# Patient Record
Sex: Male | Born: 1970 | Race: White | Hispanic: No | Marital: Married | Smoking: Former smoker
Health system: Southern US, Community
[De-identification: ages and names within clinical notes are randomized; demographics above are authoritative.]

## PROBLEM LIST (undated history)

## (undated) DIAGNOSIS — I10 Essential (primary) hypertension: Secondary | ICD-10-CM

## (undated) DIAGNOSIS — M1990 Unspecified osteoarthritis, unspecified site: Secondary | ICD-10-CM

## (undated) DIAGNOSIS — L409 Psoriasis, unspecified: Secondary | ICD-10-CM

## (undated) HISTORY — DX: Essential (primary) hypertension: I10

## (undated) HISTORY — DX: Psoriasis, unspecified: L40.9

## (undated) HISTORY — DX: Unspecified osteoarthritis, unspecified site: M19.90

---

## 2017-01-21 NOTE — Progress Notes (Signed)
Office Visit Note  Patient: Andrew Chapman             Date of Birth: [birth year]           MRN: [id number]             PCP: Lorelei Pont, DO Referring: Lorelei Pont, DO Visit Date: 01/26/2017 Occupation: Loading unloading at a furniture factory    Subjective:  Joint pain, psoriasis   History of Present Illness: Andrew Chapman is a 46 y.o. male with history of psoriasis and psoriatic arthritis seen in consultation per request of his PCP. According to patient he has had psoriasis for the last 20 years. He has used topical agents off-and-on which helps to some extent. He usually has a lot of pruritus and burning sensation in his skin. He has tried tanning bed without much relief. He recalls for the last few years he's been having increased joint pain for which he takes Aleve on when necessary basis. He experiences increased pain with the weather change. He has had lower back pain off and on. He also gives history of pain and discomfort in his bilateral hands. He has noticed a knot on his right second finger. He complains of pain and discomfort in his bilateral hip joints, bilateral knee joints, bilateral ankles bilateral feet. He also gives history of plantar fasciitis. He has bilateral lower extremity pain. His ankle swells off and on. He has intermittent swelling in his right finger. He has extensive psoriasis which involves of the scalp face trunk and extremities. He was given methotrexate 2 years ago which she took for about one year without much relief. His PCP applied for an [employer] approved Humira but wanted a prescription from rheumatologist. This positive family history of psoriasis in his mother she also suffers him psoriatic arthritis.  Activities of Daily Living:  Patient reports morning stiffness for10 minutes.   Patient Denies nocturnal pain.  Difficulty dressing/grooming: Denies Difficulty climbing stairs: Denies Difficulty getting out of chair: Denies Difficulty using  hands for taps, buttons, cutlery, and/or writing: Denies   Review of Systems  Constitutional: Positive for fatigue. Negative for night sweats and weakness ( ).  HENT: Negative for mouth sores, mouth dryness and nose dryness.   Eyes: Negative for redness and dryness.  Respiratory: Negative for shortness of breath and difficulty breathing.   Cardiovascular: Negative for chest pain, palpitations, hypertension, irregular heartbeat and swelling in legs/feet.  Gastrointestinal: Negative for constipation and diarrhea.  Endocrine: Negative for increased urination.  Musculoskeletal: Positive for arthralgias, joint pain, joint swelling and morning stiffness. Negative for myalgias, muscle weakness, muscle tenderness and myalgias.  Skin: Positive for rash. Negative for color change, hair loss, nodules/bumps, skin tightness, ulcers and sensitivity to sunlight.  Allergic/Immunologic: Negative for susceptible to infections.  Neurological: Negative for dizziness, fainting, memory loss and night sweats.  Hematological: Negative for swollen glands.  Psychiatric/Behavioral: Negative for depressed mood and sleep disturbance. The patient is not nervous/anxious.     PMFS History:  There are no active problems to display for this patient.   No past medical history on file.  No family history on file. No past surgical history on file. Social History   Social History Narrative  . No narrative on file     Objective: Vital Signs: BP 138/76   Pulse 78   Resp 14   Ht 6\' 3"  (1.905 m)   Wt 264 lb (119.7 kg)   BMI 33.00 kg/m    Physical Exam  Constitutional: He is oriented to person, place, and time. He appears well-developed and well-nourished.  HENT:  Head: Normocephalic and atraumatic.  Eyes: Conjunctivae and EOM are normal. Pupils are equal, round, and reactive to light.  Neck: Normal range of motion. Neck supple.  Cardiovascular: Normal rate, regular rhythm and normal heart sounds.     Pulmonary/Chest: Effort normal and breath sounds normal.  Abdominal: Soft. Bowel sounds are normal.  Neurological: He is alert and oriented to person, place, and time.  Skin: Skin is warm and dry. Capillary refill takes less than 2 seconds. Rash noted.  Extensive plaque psoriasis on bilateral upper extremities lower extremities trunk ,scalp and face.  Psychiatric: He has a normal mood and affect. His behavior is normal.  Nursing note and vitals reviewed.    Musculoskeletal Exam: C-spine and thoracic lumbar spine good range of motion. He has discomfort range of motion of his lumbar spine. He has tenderness on palpation of bilateral SI joint, more so on the right side. Shoulder joints elbow joints wrist joints are good range of motion. He is tenderness across MCPs PIPs and DIP joints but no active synovitis was noted. He good range of motion of his hip joints with some discomfort. Knee joints ankles MTPs PIPs DIPs are good range of motion. He has some tenderness across MTPs and PIPs of his feet with no active synovitis noted. He had tenderness over bilateral plantar fascia and bilateral lateral epicondyle area.  CDAI Exam: CDAI Homunculus Exam:   Tenderness:  Right hand: 2nd MCP, 3rd MCP and 2nd DIP Left hand: 2nd MCP RLE: tibiofemoral and tibiotalar LLE: tibiofemoral and tibiotalar Right foot: 2nd MTP Left foot: 2nd MTP  Joint Counts:  CDAI Tender Joint count: 5 CDAI Swollen Joint count: 0  Global Assessments:  Patient Global Assessment: 6 Provider Global Assessment: 4  CDAI Calculated Score: 15    Investigation: Findings:  Negative PPD 10/22/16     Imaging: Xr Foot 2 Views Left  Result Date: 01/26/2017 No MTP PIP/DIP narrowing was noted. No erosive changes were noted. No intertarsal joint space changes were noted. Normal x-ray of the foot  Xr Foot 2 Views Right  Result Date: 01/26/2017 No MTP PIP/DIP narrowing was noted. No erosive changes were noted. No intertarsal joint  space changes were noted. Normal x-ray of the foot  Xr Hand 2 View Left  Result Date: 01/26/2017 Minimal PIP/DIP and CMC changes were noted. No MCP joint narrowing or intercarpal joint narrowing was noted. No erosive changes were noted. 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 No MCP joint narrowing or intercarpal joint narrowing was noted. No erosive changes were noted. 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 No chondrocalcinosis. Impression mild osteoarthritis and mild chondromalacia patella  Xr Knee 3 View Right  Result Date: 01/26/2017 Moderate medial compartment narrowing. Mild patellofemoral narrowing. Impression moderate osteoarthritis and mild chondromalacia patella.  Xr Pelvis 1-2 Views  Result Date: 01/26/2017 Bilateral SI joint sclerosis was noted more so on the right side. These findings are consistent with inflammatory arthritis.   Speciality Comments: No specialty comments available.    Procedures:  No procedures performed Allergies: Patient has no known allergies.   Assessment / Plan:     Visit Diagnoses: Psoriatic arthropathy (HCC) patient has history of multiple arthralgias and intermittent  swelling. He has tenderness over multiple joints today. He tenderness over bilateral lateral epicondyle area plantar fascia and SI joint.  Pain hands: We'll obtain x-ray of bilateral hands today.  Pain knee joints: We'll obtain x-ray of bilateral knee joints  Pain feet. Obtain x-ray of bilateral feet  SI joint discomfort  Positive family history of psoriasis and psoriatic arthritis in his mother  Psoriasis: He has extensive psoriasis involving his scalp face, trunk and extremities.  High risk medication use - Treated with  methotrexate-inadequate response. Patient states he was approved for Humira. He has never tried Biologics. We had detailed discussion regarding different treatment options and side effects. We will apply for Humira. Was approved and the labs are available and can start him on the injections.  History of gastroesophageal reflux (GERD)    Orders: Orders Placed This Encounter  Procedures  . XR Hand 2 View Left  . XR Hand 2 View Right  . XR Foot 2 Views Left  . XR Foot 2 Views Right  . XR KNEE 3 VIEW LEFT  . XR KNEE 3 VIEW RIGHT  . XR Pelvis 1-2 Views  . DG Chest 2 View  . CBC with Differential/Platelet  . Urinalysis, Routine w reflex microscopic  . COMPLETE METABOLIC PANEL WITH GFR  . Hepatitis panel, acute  . Serum protein electrophoresis with reflex  . IgG, IgA, IgM  . Quantiferon tb gold assay (blood)  . HIV antibody   No orders of the defined types were placed in this encounter.   Face-to-face time spent with patient was 50 minutes. 50% of time was spent in counseling and coordination of care.  Follow-Up Instructions: Return for Psoriatic arthritis, psoriasis.   Pollyann Savoy, MD  Note - This record has been created using Animal nutritionist.  Chart creation errors have been sought, but may not always  have been located. Such creation errors do not reflect on  the standard of medical care.

## 2017-01-26 ENCOUNTER — Ambulatory Visit (INDEPENDENT_AMBULATORY_CARE_PROVIDER_SITE_OTHER): Payer: BLUE CROSS/BLUE SHIELD | Admitting: Rheumatology

## 2017-01-26 ENCOUNTER — Telehealth: Payer: Self-pay

## 2017-01-26 ENCOUNTER — Encounter (INDEPENDENT_AMBULATORY_CARE_PROVIDER_SITE_OTHER): Payer: Self-pay

## 2017-01-26 ENCOUNTER — Ambulatory Visit (INDEPENDENT_AMBULATORY_CARE_PROVIDER_SITE_OTHER): Payer: Self-pay

## 2017-01-26 ENCOUNTER — Ambulatory Visit (HOSPITAL_COMMUNITY)
Admission: RE | Admit: 2017-01-26 | Discharge: 2017-01-26 | Disposition: A | Discharge status: Home / Self Care | Payer: BLUE CROSS/BLUE SHIELD | Source: Ambulatory Visit | Attending: Rheumatology | Admitting: Rheumatology

## 2017-01-26 ENCOUNTER — Encounter: Payer: Self-pay | Admitting: Rheumatology

## 2017-01-26 VITALS — BP 138/76 | HR 78 | Resp 14 | Ht 75.0 in | Wt 264.0 lb

## 2017-01-26 DIAGNOSIS — M25561 Pain in right knee: Secondary | ICD-10-CM

## 2017-01-26 DIAGNOSIS — L4050 Arthropathic psoriasis, unspecified: Secondary | ICD-10-CM | POA: Diagnosis not present

## 2017-01-26 DIAGNOSIS — G8929 Other chronic pain: Secondary | ICD-10-CM

## 2017-01-26 DIAGNOSIS — L409 Psoriasis, unspecified: Secondary | ICD-10-CM

## 2017-01-26 DIAGNOSIS — Z8719 Personal history of other diseases of the digestive system: Secondary | ICD-10-CM

## 2017-01-26 DIAGNOSIS — Z114 Encounter for screening for human immunodeficiency virus [HIV]: Secondary | ICD-10-CM

## 2017-01-26 DIAGNOSIS — M25562 Pain in left knee: Secondary | ICD-10-CM | POA: Diagnosis not present

## 2017-01-26 DIAGNOSIS — Z111 Encounter for screening for respiratory tuberculosis: Secondary | ICD-10-CM

## 2017-01-26 DIAGNOSIS — M79671 Pain in right foot: Secondary | ICD-10-CM

## 2017-01-26 DIAGNOSIS — M79641 Pain in right hand: Secondary | ICD-10-CM | POA: Diagnosis not present

## 2017-01-26 DIAGNOSIS — M79642 Pain in left hand: Secondary | ICD-10-CM | POA: Diagnosis not present

## 2017-01-26 DIAGNOSIS — M79672 Pain in left foot: Secondary | ICD-10-CM

## 2017-01-26 DIAGNOSIS — M533 Sacrococcygeal disorders, not elsewhere classified: Secondary | ICD-10-CM | POA: Diagnosis not present

## 2017-01-26 DIAGNOSIS — Z79899 Other long term (current) drug therapy: Secondary | ICD-10-CM | POA: Diagnosis not present

## 2017-01-26 DIAGNOSIS — Z1159 Encounter for screening for other viral diseases: Secondary | ICD-10-CM | POA: Diagnosis not present

## 2017-01-26 LAB — CBC WITH DIFFERENTIAL/PLATELET
BASOS ABS: 88 {cells}/uL (ref 0–200)
Basophils Relative: 1 %
Eosinophils Absolute: 264 cells/uL (ref 15–500)
Eosinophils Relative: 3 %
HEMATOCRIT: 47 % (ref 38.5–50.0)
HEMOGLOBIN: 15.8 g/dL (ref 13.2–17.1)
LYMPHS ABS: 2112 {cells}/uL (ref 850–3900)
Lymphocytes Relative: 24 %
MCH: 30.4 pg (ref 27.0–33.0)
MCHC: 33.6 g/dL (ref 32.0–36.0)
MCV: 90.4 fL (ref 80.0–100.0)
MONO ABS: 704 {cells}/uL (ref 200–950)
MPV: 9.1 fL (ref 7.5–12.5)
Monocytes Relative: 8 %
NEUTROS ABS: 5632 {cells}/uL (ref 1500–7800)
NEUTROS PCT: 64 %
Platelets: 343 10*3/uL (ref 140–400)
RBC: 5.2 MIL/uL (ref 4.20–5.80)
RDW: 14 % (ref 11.0–15.0)
WBC: 8.8 10*3/uL (ref 3.8–10.8)

## 2017-01-26 LAB — COMPLETE METABOLIC PANEL WITH GFR
ALBUMIN: 4.6 g/dL (ref 3.6–5.1)
ALK PHOS: 81 U/L (ref 40–115)
ALT: 25 U/L (ref 9–46)
AST: 18 U/L (ref 10–40)
BILIRUBIN TOTAL: 0.7 mg/dL (ref 0.2–1.2)
BUN: 22 mg/dL (ref 7–25)
CALCIUM: 9.8 mg/dL (ref 8.6–10.3)
CO2: 24 mmol/L (ref 20–31)
CREATININE: 0.92 mg/dL (ref 0.60–1.35)
Chloride: 104 mmol/L (ref 98–110)
GFR, Est African American: >89 mL/min (ref >=60)
GFR, Est Non African American: >89 mL/min (ref >=60)
GLUCOSE: 83 mg/dL (ref 65–99)
POTASSIUM: 4.9 mmol/L (ref 3.5–5.3)
SODIUM: 141 mmol/L (ref 135–146)
TOTAL PROTEIN: 7.3 g/dL (ref 6.1–8.1)

## 2017-01-26 NOTE — Telephone Encounter (Signed)
A prior authorization was submitted via Cover My Meds for Humira. The authorization was approved from 12/27/2016 through 04/26/2017. Per Juanetta GoslingLaney, a representative from [employer], we will receive a letter via fax and one will be mailed to patient. His medication will require Rx to be filled at a specialty pharmacy. [facility] [phone]  Will send document to scan center.   Case ID: [id number]  Abran DukeHopkins, Bently Morath, CPhT 11:49 AM

## 2017-01-26 NOTE — Progress Notes (Signed)
Pharmacy Note Subjective: Patient presents today to the [facility] to see Dr. Corliss Skainseveshwar.   Patient seen by the pharmacist for counseling on Humira.    Objective: TB Test: ordered today Hepatitis panel: ordered today HIV: ordered today CBC, CMP: ordered today  Assessment/Plan:  Counseled patient that Humira is a TNF blocking agent.  Reviewed Humira dose of 40 mg every other week.  Counseled patient on purpose, proper use, and adverse effects of Humira.  Reviewed the most common adverse effects including infections, headache, and injection site reactions. Discussed that there is the possibility of an increased risk of malignancy but it is not well understood if this increased risk is due to the medication or the disease state.  Advised patient to get yearly dermatology exams due to risk of skin cancer.  Reviewed the importance of regular labs while on Humira therapy.  Counseled patient that Humira should be held prior to scheduled surgery.  Counseled patient to avoid live vaccines while on Humira.  Advised patient to get annual influenza vaccine and the pneumococcal vaccine as needed.  Provided patient with medication education material and answered all questions.  Patient voiced understanding.  Patient consented to Humira.  Will upload consent into the media tab.  Reviewed storage instructions of Humira.  Humira was approved by patient's insurance whlie he was in the office.  Prescription will have to go [facility].  Will plan to send in Humira prescription once labs return.  Patient is aware that initial fill must be delivered to clinic and patient will need nurse visit for initial injection.    Lilla Shookachel Henderson, Pharm.D., BCPS Clinical Pharmacist Pager: [phone] Phone: [phone] 01/26/2017 11:03 AM

## 2017-01-26 NOTE — Patient Instructions (Addendum)
Go to [facility] for your Chest Xray, [address] [employer]   We recommend getting a pneumonia vaccine.  Please discuss this with your primary care provider.    Adalimumab Injection What is this medicine? ADALIMUMAB (a dal AYE mu mab) is used to treat rheumatoid and psoriatic arthritis. It is also used to treat ankylosing spondylitis, Crohn's disease, ulcerative colitis, plaque psoriasis, hidradenitis suppurativa, and uveitis. This medicine may be used for other purposes; ask your health care provider or pharmacist if you have questions. COMMON BRAND NAME(S): CYLTEZO, Humira What should I tell my health care provider before I take this medicine? They need to know if you have any of these conditions: -diabetes -heart disease -hepatitis B or history of hepatitis B infection -immune system problems -infection or history of infections -multiple sclerosis -recently received or scheduled to receive a vaccine -scheduled to have surgery -tuberculosis, a positive skin test for tuberculosis or have recently been in close contact with someone who has tuberculosis -an unusual reaction to adalimumab, other medicines, mannitol, latex, rubber, foods, dyes, or preservatives -pregnant or trying to get pregnant -breast-feeding How should I use this medicine? This medicine is for injection under the skin. You will be taught how to prepare and give this medicine. Use exactly as directed. Take your medicine at regular intervals. Do not take your medicine more often than directed. A special MedGuide will be given to you by the pharmacist with each prescription and refill. Be sure to read this information carefully each time. It is important that you put your used needles and syringes in a special sharps container. Do not put them in a trash can. If you do not have a sharps container, call your pharmacist or healthcare provider to get one. Talk to your pediatrician regarding the use of this  medicine in children. While this drug may be prescribed for children as young as 2 years for selected conditions, precautions do apply. The manufacturer of the medicine offers free information to patients and their health care partners. Call 4788380125 for more information. Overdosage: If you think you have taken too much of this medicine contact a poison control center or emergency room at once. NOTE: This medicine is only for you. Do not share this medicine with others. What if I miss a dose? If you miss a dose, take it as soon as you can. If it is almost time for your next dose, take only that dose. Do not take double or extra doses. Give the next dose when your next scheduled dose is due. Call your doctor or health care professional if you are not sure how to handle a missed dose. What may interact with this medicine? Do not take this medicine with any of the following medications: -abatacept -anakinra -etanercept -infliximab -live virus vaccines -rilonacept This medicine may also interact with the following medications: -vaccines This list may not describe all possible interactions. Give your health care provider a list of all the medicines, herbs, non-prescription drugs, or dietary supplements you use. Also tell them if you smoke, drink alcohol, or use illegal drugs. Some items may interact with your medicine. What should I watch for while using this medicine? Visit your doctor or health care professional for regular checks on your progress. Tell your doctor or healthcare professional if your symptoms do not start to get better or if they get worse. You will be tested for tuberculosis (TB) before you start this medicine. If your doctor prescribes any medicine for  TB, you should start taking the TB medicine before starting this medicine. Make sure to finish the full course of TB medicine. Call your doctor or health care professional if you get a cold or other infection while receiving  this medicine. Do not treat yourself. This medicine may decrease your body's ability to fight infection. Talk to your doctor about your risk of cancer. You may be more at risk for certain types of cancers if you take this medicine. What side effects may I notice from receiving this medicine? Side effects that you should report to your doctor or health care professional as soon as possible: -allergic reactions like skin rash, itching or hives, swelling of the face, lips, or tongue -breathing problems -changes in vision -chest pain -fever, chills, or any other sign of infection -numbness or tingling -red, scaly patches or raised bumps on the skin -swelling of the ankles -swollen lymph nodes in the neck, underarm, or groin areas -unexplained weight loss -unusual bleeding or bruising -unusually weak or tired Side effects that usually do not require medical attention (report to your doctor or health care professional if they continue or are bothersome): -headache -nausea -redness, itching, swelling, or bruising at site where injected This list may not describe all possible side effects. Call your doctor for medical advice about side effects. You may report side effects to FDA at 1-800-FDA-1088. Where should I keep my medicine? Keep out of the reach of children. Store in the original container and in the refrigerator between 2 and 8 degrees C (36 and 46 degrees F). Do not freeze. The product may be stored in a cool carrier with an ice pack, if needed. Protect from light. Throw away any unused medicine after the expiration date. NOTE: This sheet is a summary. It may not cover all possible information. If you have questions about this medicine, talk to your doctor, pharmacist, or health care provider.  2018 Elsevier/Gold Standard (2015-02-26 11:11:43)

## 2017-01-26 NOTE — Progress Notes (Signed)
WNL

## 2017-01-27 LAB — URINALYSIS, ROUTINE W REFLEX MICROSCOPIC
Bilirubin Urine: NEGATIVE
Glucose, UA: NEGATIVE
Hgb urine dipstick: NEGATIVE
KETONES UR: NEGATIVE
Leukocytes, UA: NEGATIVE
NITRITE: NEGATIVE
PH: 6 (ref 5.0–8.0)
Protein, ur: NEGATIVE
SPECIFIC GRAVITY, URINE: 1.025 (ref 1.001–1.035)

## 2017-01-27 LAB — HEPATITIS PANEL, ACUTE
HCV Ab: NEGATIVE
HEP B S AG: NEGATIVE
Hep A IgM: NONREACTIVE
Hep B C IgM: NONREACTIVE

## 2017-01-27 LAB — HIV ANTIBODY (ROUTINE TESTING W REFLEX): HIV 1&2 Ab, 4th Generation: NONREACTIVE

## 2017-01-28 LAB — QUANTIFERON TB GOLD ASSAY (BLOOD)
INTERFERON GAMMA RELEASE ASSAY: NEGATIVE
Mitogen-Nil: 7.09 IU/mL
QUANTIFERON NIL VALUE: 0.04 [IU]/mL
Quantiferon Tb Ag Minus Nil Value: 0 IU/mL

## 2017-01-28 LAB — IGG, IGA, IGM
IGA: 204 mg/dL (ref 81–463)
IGG (IMMUNOGLOBIN G), SERUM: 1104 mg/dL (ref 694–1618)
IgM, Serum: 59 mg/dL (ref 48–271)

## 2017-01-28 LAB — PROTEIN ELECTROPHORESIS, SERUM, WITH REFLEX
ALBUMIN ELP: 4.4 g/dL (ref 3.8–4.8)
Alpha-1-Globulin: 0.3 g/dL (ref 0.2–0.3)
Alpha-2-Globulin: 0.7 g/dL (ref 0.5–0.9)
BETA 2: 0.4 g/dL (ref 0.2–0.5)
Beta Globulin: 0.4 g/dL (ref 0.4–0.6)
Gamma Globulin: 1.1 g/dL (ref 0.8–1.7)
TOTAL PROTEIN, SERUM ELECTROPHOR: 7.3 g/dL (ref 6.1–8.1)

## 2017-02-01 NOTE — Progress Notes (Signed)
WNL

## 2017-02-03 ENCOUNTER — Telehealth: Payer: Self-pay | Admitting: Pharmacist

## 2017-02-03 MED ORDER — ADALIMUMAB 40 MG/0.8ML ~~LOC~~ AJKT: 40.0000 mg | AUTO-INJECTOR | SUBCUTANEOUS | 2 refills | Status: DC

## 2017-02-03 NOTE — Telephone Encounter (Signed)
Plan per 01/26/17 office visit was to start patient on Humira once labs and chest x-ray were back.  Labs were all normal and chest x-ray showed no active cardiopulmonary disease.    Called patient to discuss Humira.  I spoke to his wife who confirms patient wants to wait and start initial Humira injection at his follow up visit on 03/03/17 as he does not want to take off work.  I will send Humira prescription to the specialty pharmacy ([facility]) and have them deliver initial prescription to clinic for clinic administration.    Lilla Shookachel Fitzhugh Vizcarrondo, Pharm.D., BCPS, CPP Clinical Pharmacist Pager: [phone] Phone: [phone] 02/03/2017 3:38 PM

## 2017-02-28 ENCOUNTER — Telehealth: Payer: Self-pay | Admitting: Pharmacist

## 2017-02-28 NOTE — Telephone Encounter (Signed)
Patient has new patient follow up on 03/03/17 and wanted to start Humira at new patient visit.  I called [facility] to check on status of patient's prescription as medication has not been delivered to clinic yet.  I spoke to BrittonDenise who reports the medication was delivered 02/14/17 to patient's home.  I called patient and spoke to his wife who confirmed that patient received prescription.  It is being stored in the refrigerator.  I advised that we cannot let them bring in medication for administration in clinic therefore I will set aside a sample for patient to use for his initial injection on Thursday.  She voiced understanding.  Lilla Shookachel Henderson, Pharm.D., BCPS, CPP Clinical Pharmacist Pager: [phone] Phone: [phone] 02/28/2017 12:44 PM

## 2017-03-01 DIAGNOSIS — G8929 Other chronic pain: Secondary | ICD-10-CM | POA: Insufficient documentation

## 2017-03-01 DIAGNOSIS — L4050 Arthropathic psoriasis, unspecified: Secondary | ICD-10-CM | POA: Insufficient documentation

## 2017-03-01 DIAGNOSIS — Z8719 Personal history of other diseases of the digestive system: Secondary | ICD-10-CM | POA: Insufficient documentation

## 2017-03-01 DIAGNOSIS — M533 Sacrococcygeal disorders, not elsewhere classified: Secondary | ICD-10-CM

## 2017-03-01 DIAGNOSIS — Z79899 Other long term (current) drug therapy: Secondary | ICD-10-CM | POA: Insufficient documentation

## 2017-03-01 DIAGNOSIS — L409 Psoriasis, unspecified: Secondary | ICD-10-CM | POA: Insufficient documentation

## 2017-03-01 NOTE — Progress Notes (Signed)
Office Visit Note  Patient: Andrew Chapman             Date of Birth: 10/31/1970           MRN: [id number]             PCP: Emelda Fear, DO Referring: Emelda Fear, DO Visit Date: 03/03/2017 Occupation: @GUAROCC @    Subjective:  Pain in joints and psoriasis.   History of Present Illness: Roddy Bellamy is a 46 y.o. male with history of psoriatic arthritis and psoriasis. He states he continues to have pain and discomfort in his joints specialty's ankle joint and SI joints. His psoriasis is still quite active.  Activities of Daily Living:  Patient reports morning stiffness for 1 hour.   Patient Denies nocturnal pain.  Difficulty dressing/grooming: Denies Difficulty climbing stairs: Denies Difficulty getting out of chair: Denies Difficulty using hands for taps, buttons, cutlery, and/or writing: Denies   Review of Systems  Constitutional: Positive for fatigue. Negative for night sweats and weakness ( ).  HENT: Negative for mouth sores, mouth dryness and nose dryness.   Eyes: Negative for redness and dryness.  Respiratory: Negative for shortness of breath and difficulty breathing.   Cardiovascular: Negative for chest pain, palpitations, hypertension, irregular heartbeat and swelling in legs/feet.  Gastrointestinal: Negative for constipation and diarrhea.  Endocrine: Negative for increased urination.  Musculoskeletal: Positive for arthralgias, joint pain, joint swelling and morning stiffness. Negative for myalgias, muscle weakness, muscle tenderness and myalgias.  Skin: Positive for rash. Negative for color change, hair loss, nodules/bumps, skin tightness, ulcers and sensitivity to sunlight.  Allergic/Immunologic: Negative for susceptible to infections.  Neurological: Negative for dizziness, fainting, memory loss and night sweats.  Hematological: Negative for swollen glands.  Psychiatric/Behavioral: Negative for depressed mood and sleep disturbance. The patient is not  nervous/anxious.     PMFS History:  Patient Active Problem List   Diagnosis Date Noted  . Family history of psoriatic arthritis 03/02/2017  . Psoriasis 03/01/2017  . Psoriatic arthropathy ([place]) 03/01/2017  . High risk medication use 03/01/2017  . History of gastroesophageal reflux (GERD) 03/01/2017  . 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 No pertinent past medical history.  History reviewed. No pertinent family history. History reviewed. No pertinent surgical history. Social History   Social History Narrative  . No narrative on file     Objective: Vital Signs: BP 139/86 (BP Location: Left Arm, Patient Position: Sitting, Cuff Size: Normal)   Pulse 77   Resp 16   Ht 6' 3"  (1.905 m)   Wt 271 lb (122.9 kg)   BMI 33.87 kg/m    Physical Exam  Constitutional: He is oriented to person, place, and time. He appears well-developed and well-nourished.  HENT:  Head: Normocephalic and atraumatic.  Eyes: Pupils are equal, round, and reactive to light. Conjunctivae and EOM are normal.  Neck: Normal range of motion. Neck supple.  Cardiovascular: Normal rate, regular rhythm and normal heart sounds.   Pulmonary/Chest: Effort normal and breath sounds normal.  Abdominal: Soft. Bowel sounds are normal.  Neurological: He is alert and oriented to person, place, and time.  Skin: Skin is warm and dry. Capillary refill takes less than 2 seconds. Rash noted.  Extensive psoriasis over trunk and extremeties  Psychiatric: He has a normal mood and affect. His behavior is normal.  Nursing note and vitals reviewed.    Musculoskeletal Exam: C-spine and thoracic lumbar spine good range of motion. He has tenderness over bilateral SI  joint area. Shoulder joints are good range of motion. He is tenderness over bilateral lateral epicondyle area. He is some tenderness across his PIPs and DIP joints but no synovitis was noted. Hip joints and knee joints are good range of motion. Is tenderness  over bilateral knee joints and ankle joints.  CDAI Exam: CDAI Homunculus Exam:   Tenderness:  RLE: tibiofemoral and tibiotalar LLE: tibiofemoral and tibiotalar  Joint Counts:  CDAI Tender Joint count: 2 CDAI Swollen Joint count: 0  Global Assessments:  Patient Global Assessment: 7 Provider Global Assessment: 7  CDAI Calculated Score: 16    Investigation: Findings:  01/26/2017 Hepatitis panel, acute ;HIV antibody ; IgG, IgA, IgM; Quantiferon tb gold assay ; Serum protein electrophoresis with reflex; and Urinalysis, Routine w reflex microscopic all normal /negative     CBC Latest Ref Rng & Units 01/26/2017  WBC 3.8 - 10.8 K/uL 8.8  Hemoglobin 13.2 - 17.1 g/dL 15.8  Hematocrit 38.5 - 50.0 % 47.0  Platelets 140 - 400 K/uL 343   CMP Latest Ref Rng & Units 01/26/2017  Glucose 65 - 99 mg/dL 83  BUN 7 - 25 mg/dL 22  Creatinine 0.60 - 1.35 mg/dL 0.92  Sodium 135 - 146 mmol/L 141  Potassium 3.5 - 5.3 mmol/L 4.9  Chloride 98 - 110 mmol/L 104  CO2 20 - 31 mmol/L 24  Calcium 8.6 - 10.3 mg/dL 9.8  Total Protein 6.1 - 8.1 g/dL 7.3  Total Bilirubin 0.2 - 1.2 mg/dL 0.7  Alkaline Phos 40 - 115 U/L 81  AST 10 - 40 U/L 18  ALT 9 - 46 U/L 25    Imaging: No results found.  Speciality Comments: No specialty comments available.    Procedures:  No procedures performed Allergies: Patient has no known allergies.   Assessment / Plan:     Visit Diagnoses: Psoriatic arthropathy ([place]) - History of arthritis, epicondylitis, plantar fasciitis, sacroiliitis and psoriasis.We had consented him on Humira last visit. Side effects and contraindications were discussed at length. He was given his first Humira injection in the office today and was observed in the office for 30 minutes for any side effects. He tolerated Humira well.  Psoriasis: He has extensive psoriasis involving his extremities and trunk. We will see response to Humira. If he has inadequate response we may add methotrexate at next  visit.  High risk medication use -  (Methotrexate -inadequate response), Humira 40 mg subcutaneous first injection given today. We will check labs in a month and then every 3 months to monitor for drug toxicity. 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  History of gastroesophageal reflux (GERD)  Family history of psoriatic arthritis -  in his mother    Orders: Orders Placed This Encounter  Procedures  . CBC with Differential/Platelet  . CMP14+EGFR   No orders of the defined types were placed in this encounter.   Face-to-face time spent with patient was 30 minutes. 50% of time was spent in counseling and coordination of care.  Follow-Up Instructions: Return in about 3 months (around 06/03/2017) for Psoriatic arthritis Psoriasis.   [name], MD  Note - This record has been created using Editor, commissioning.  Chart creation errors have been sought, but may not always  have been located. Such creation errors do not reflect on  the standard of medical care.

## 2017-03-02 DIAGNOSIS — Z840 Family history of diseases of the skin and subcutaneous tissue: Secondary | ICD-10-CM | POA: Insufficient documentation

## 2017-03-02 DIAGNOSIS — Z8261 Family history of arthritis: Secondary | ICD-10-CM | POA: Insufficient documentation

## 2017-03-03 ENCOUNTER — Ambulatory Visit: Payer: Self-pay | Admitting: Rheumatology

## 2017-03-03 ENCOUNTER — Encounter: Payer: Self-pay | Admitting: Rheumatology

## 2017-03-03 ENCOUNTER — Ambulatory Visit (INDEPENDENT_AMBULATORY_CARE_PROVIDER_SITE_OTHER): Payer: BLUE CROSS/BLUE SHIELD | Admitting: Rheumatology

## 2017-03-03 VITALS — BP 139/86 | HR 77 | Resp 16 | Ht 75.0 in | Wt 271.0 lb

## 2017-03-03 DIAGNOSIS — Z8261 Family history of arthritis: Secondary | ICD-10-CM | POA: Diagnosis not present

## 2017-03-03 DIAGNOSIS — M533 Sacrococcygeal disorders, not elsewhere classified: Secondary | ICD-10-CM | POA: Diagnosis not present

## 2017-03-03 DIAGNOSIS — Z8719 Personal history of other diseases of the digestive system: Secondary | ICD-10-CM | POA: Diagnosis not present

## 2017-03-03 DIAGNOSIS — Z79899 Other long term (current) drug therapy: Secondary | ICD-10-CM

## 2017-03-03 DIAGNOSIS — G8929 Other chronic pain: Secondary | ICD-10-CM | POA: Diagnosis not present

## 2017-03-03 DIAGNOSIS — Z840 Family history of diseases of the skin and subcutaneous tissue: Secondary | ICD-10-CM

## 2017-03-03 DIAGNOSIS — L409 Psoriasis, unspecified: Secondary | ICD-10-CM | POA: Diagnosis not present

## 2017-03-03 DIAGNOSIS — L4050 Arthropathic psoriasis, unspecified: Secondary | ICD-10-CM

## 2017-03-03 NOTE — Patient Instructions (Addendum)
Standing Labs We placed an order today for your standing lab work.    Please come back and get your standing labs in 1 month then every 3 months.  We have open lab Monday through Friday from 8:30-11:30 AM and 1:30-4 PM at the office of Dr. Pollyann Savoy.   The office is located at [address], [place], [place] [address] No appointment is necessary.   Labs are drawn by [facility].  You may receive a bill from [facility] for your lab work. If you have any questions regarding directions or hours of operation,  please call [phone].    Adalimumab Injection What is this medicine? ADALIMUMAB (a dal AYE mu mab) is used to treat rheumatoid and psoriatic arthritis. It is also used to treat ankylosing spondylitis, Crohn's disease, ulcerative colitis, plaque psoriasis, hidradenitis suppurativa, and uveitis. This medicine may be used for other purposes; ask your health care provider or pharmacist if you have questions. COMMON BRAND NAME(S): CYLTEZO, Humira What should I tell my health care provider before I take this medicine? They need to know if you have any of these conditions: -diabetes -heart disease -hepatitis B or history of hepatitis B infection -immune system problems -infection or history of infections -multiple sclerosis -recently received or scheduled to receive a vaccine -scheduled to have surgery -tuberculosis, a positive skin test for tuberculosis or have recently been in close contact with someone who has tuberculosis -an unusual reaction to adalimumab, other medicines, mannitol, latex, rubber, foods, dyes, or preservatives -pregnant or trying to get pregnant -breast-feeding How should I use this medicine? This medicine is for injection under the skin. You will be taught how to prepare and give this medicine. Use exactly as directed. Take your medicine at regular intervals. Do not take your medicine more often than directed. A special MedGuide will be given to you  by the pharmacist with each prescription and refill. Be sure to read this information carefully each time. It is important that you put your used needles and syringes in a special sharps container. Do not put them in a trash can. If you do not have a sharps container, call your pharmacist or healthcare provider to get one. Talk to your pediatrician regarding the use of this medicine in children. While this drug may be prescribed for children as young as 2 years for selected conditions, precautions do apply. The manufacturer of the medicine offers free information to patients and their health care partners. Call [phone] for more information. Overdosage: If you think you have taken too much of this medicine contact a poison control center or emergency room at once. NOTE: This medicine is only for you. Do not share this medicine with others. What if I miss a dose? If you miss a dose, take it as soon as you can. If it is almost time for your next dose, take only that dose. Do not take double or extra doses. Give the next dose when your next scheduled dose is due. Call your doctor or health care professional if you are not sure how to handle a missed dose. What may interact with this medicine? Do not take this medicine with any of the following medications: -abatacept -anakinra -etanercept -infliximab -live virus vaccines -rilonacept This medicine may also interact with the following medications: -vaccines This list may not describe all possible interactions. Give your health care provider a list of all the medicines, herbs, non-prescription drugs, or dietary supplements you use. Also tell them if you smoke, drink  alcohol, or use illegal drugs. Some items may interact with your medicine. What should I watch for while using this medicine? Visit your doctor or health care professional for regular checks on your progress. Tell your doctor or healthcare professional if your symptoms do not start to  get better or if they get worse. You will be tested for tuberculosis (TB) before you start this medicine. If your doctor prescribes any medicine for TB, you should start taking the TB medicine before starting this medicine. Make sure to finish the full course of TB medicine. Call your doctor or health care professional if you get a cold or other infection while receiving this medicine. Do not treat yourself. This medicine may decrease your body's ability to fight infection. Talk to your doctor about your risk of cancer. You may be more at risk for certain types of cancers if you take this medicine. What side effects may I notice from receiving this medicine? Side effects that you should report to your doctor or health care professional as soon as possible: -allergic reactions like skin rash, itching or hives, swelling of the face, lips, or tongue -breathing problems -changes in vision -chest pain -fever, chills, or any other sign of infection -numbness or tingling -red, scaly patches or raised bumps on the skin -swelling of the ankles -swollen lymph nodes in the neck, underarm, or groin areas -unexplained weight loss -unusual bleeding or bruising -unusually weak or tired Side effects that usually do not require medical attention (report to your doctor or health care professional if they continue or are bothersome): -headache -nausea -redness, itching, swelling, or bruising at site where injected This list may not describe all possible side effects. Call your doctor for medical advice about side effects. You may report side effects to FDA at 1-800-FDA-1088. Where should I keep my medicine? Keep out of the reach of children. Store in the original container and in the refrigerator between 2 and 8 degrees C (36 and 46 degrees F). Do not freeze. The product may be stored in a cool carrier with an ice pack, if needed. Protect from light. Throw away any unused medicine after the expiration  date. NOTE: This sheet is a summary. It may not cover all possible information. If you have questions about this medicine, talk to your doctor, pharmacist, or health care provider.  2018 Elsevier/Gold Standard (2015-02-26 11:11:43)

## 2017-03-03 NOTE — Progress Notes (Signed)
Pharmacy Note  Subjective:   Patient is being initiated on Humira.  Patient was previously counseled extensively on Humira on 01/26/17 and consented to initiation of Humira at that time.  Patient presents to clinic today to receive the first dose of Humira.     Objective: CMP     Component Value Date/Time   NA 141 01/26/2017 1120   K 4.9 01/26/2017 1120   CL 104 01/26/2017 1120   CO2 24 01/26/2017 1120   GLUCOSE 83 01/26/2017 1120   BUN 22 01/26/2017 1120   CREATININE 0.92 01/26/2017 1120   CALCIUM 9.8 01/26/2017 1120   PROT 7.3 01/26/2017 1120   ALBUMIN 4.6 01/26/2017 1120   AST 18 01/26/2017 1120   ALT 25 01/26/2017 1120   ALKPHOS 81 01/26/2017 1120   BILITOT 0.7 01/26/2017 1120   GFRNONAA >89 01/26/2017 1120   GFRAA >89 01/26/2017 1120    CBC    Component Value Date/Time   WBC 8.8 01/26/2017 1120   RBC 5.20 01/26/2017 1120   HGB 15.8 01/26/2017 1120   HCT 47.0 01/26/2017 1120   PLT 343 01/26/2017 1120   MCV 90.4 01/26/2017 1120   MCH 30.4 01/26/2017 1120   MCHC 33.6 01/26/2017 1120   RDW 14.0 01/26/2017 1120   LYMPHSABS 2,112 01/26/2017 1120   MONOABS 704 01/26/2017 1120   EOSABS 264 01/26/2017 1120   BASOSABS 88 01/26/2017 1120   TB Gold: negative (01/26/17)  Assessment/Plan:  Reviewed the purpose, proper use, and adverse effects of Humira.  Patient was counseled on how to administer subcutaneous Humira injection using a demonstration pen.  Patient administered the Humira injection using a sample pen.  The medication was administered in the right thigh.  Lot: [id number], Exp. 06/2018.  Patient was monitored for 30 minutes post injection.  No injection site reaction noted.  Patient will need standing lab orders in one month.  Provided patient with standing lab instructions and placed standing lab order.    Lilla Shookachel Shaleta Ruacho, Pharm.D., BCPS Clinical Pharmacist Pager: [phone] Phone: [phone] 03/03/2017 2:42 PM

## 2017-04-06 ENCOUNTER — Telehealth: Payer: Self-pay | Admitting: Radiology

## 2017-04-06 NOTE — Telephone Encounter (Signed)
Probably related to Humira use. He'll continue to monitor labs.

## 2017-04-06 NOTE — Telephone Encounter (Signed)
Received labs for patient via fax today his ALT is 47 otherwise CMP is normal, his CBC is normal   He is on Humira only from you.   Sent for scan

## 2017-04-07 NOTE — Telephone Encounter (Addendum)
I called pt to advise left message that I have sent him a my chart message.

## 2017-04-28 ENCOUNTER — Encounter: Payer: Self-pay | Admitting: Rheumatology

## 2017-04-28 ENCOUNTER — Telehealth: Payer: Self-pay

## 2017-04-28 NOTE — Telephone Encounter (Signed)
Patients PA for Humira expired on 04/26/2017. An authorization was submitted via cover my meds. Received an initial denial due to a previous denial from March 2018 with a previous provider. Called [facility] and spoke with [place]. She was able to process the authorization over the phone.   Humira has been approved from 04/28/2017 through 04/28/2018.  Case ID: [id number]  Abran DukeHopkins, Fran [place]onger, CPhT 2:20 PM

## 2017-04-29 NOTE — Telephone Encounter (Signed)
Called insurance to verify that the patient can get a 90 day supply of the Humira Syringes at a time. Spoke with [place] who states that the pt is able to get a 90 day supply through mail order pharmacy.   Called patient to inform him. Left message on patients voicemail.  Quadry Kampa, [place], CPhT 9:07 AM

## 2017-05-07 ENCOUNTER — Other Ambulatory Visit: Payer: Self-pay | Admitting: Rheumatology

## 2017-05-09 NOTE — Telephone Encounter (Signed)
Last Visit: 03/03/17 Next Visit: 06/07/17 Labs:04/05/17 ALT is 47 otherwise CMP is normal, his CBC is normal  TB Gold: 01/26/17 Neg  Okay to refill per Dr. Corliss Skains

## 2017-05-10 ENCOUNTER — Telehealth: Payer: Self-pay

## 2017-05-10 NOTE — Telephone Encounter (Signed)
Received a faxed request from [facility] for Humira Pens prior authorization. Pt has authorization on file for Humira Syringes. Viewed pt profile and found that patient received an Rx for Pens and used a "pen" for his initial dose given in the clinic on 03/03/2017.   [facility]ed [facility] to get his authorization changed. Spoke with Carlis Stable who was able to update the pt profile and authorize the Humira PENS from 03/29/2017 through 04/28/2018. Patient is able to get a 90 day supply at a time.  Reference number: [id number] Phone number: [phone]  Called patient to update him. Left message on answering machine.   Called [facility] to update. Spoke with Colin Mulders who updated the patients profile with the authorization number. Patient will need this medication this week. She sent a request to the pharmacists to expedite the request to have the medication sent out to the patient. They will reach out to patient today to set up shipment.   Jalia Zuniga, [place], CPhT 11:41 AM

## 2017-05-29 NOTE — Progress Notes (Signed)
Office Visit Note  Patient: Andrew Chapman             Date of Birth: [birth year]           MRN: [id number]             PCP: Emelda Fear, DO Referring: Emelda Fear, DO Visit Date: 06/07/2017 Occupation: _0 @    Subjective:  Muscle pain, left ankle pain.   History of Present Illness: Katlin Ciszewski is a 46 y.o. male with history of psoriatic arthritis and psoriasis. He started Humira in June 2018. He has noticed about 50% improvement in his arthritis symptoms and 90% improvement in his psoriasis. He still continues to have some discomfort in his hands, left ankle joint and SI joints. He also complains of muscle pain which she describes in this bilateral quadriceps area. He has taken methotrexate in the past but discontinued due to inadequate response.  Activities of Daily Living:  Patient reports morning stiffness for 1 hour.   Patient Denies nocturnal pain.  Difficulty dressing/grooming: Denies Difficulty climbing stairs: Denies Difficulty getting out of chair: Denies Difficulty using hands for taps, buttons, cutlery, and/or writing: Denies   Review of Systems  Constitutional: Negative for fatigue, night sweats and weakness.  HENT: Negative for mouth sores, mouth dryness and nose dryness.   Eyes: Negative for redness and dryness.  Respiratory: Negative for shortness of breath and difficulty breathing.   Cardiovascular: Negative for chest pain, palpitations, hypertension, irregular heartbeat and swelling in legs/feet.  Gastrointestinal: Negative for constipation and diarrhea.  Endocrine: Negative for excessive thirst and increased urination.  Genitourinary: Negative for difficulty urinating.  Musculoskeletal: Positive for arthralgias, joint pain, morning stiffness and muscle tenderness. Negative for joint swelling, myalgias, muscle weakness and myalgias.  Skin: Negative for color change, rash, hair loss, nodules/bumps, skin tightness, ulcers and sensitivity to sunlight.    Allergic/Immunologic: Negative for susceptible to infections.  Neurological: Negative for dizziness, fainting, light-headedness, memory loss and night sweats.  Hematological: Negative for bruising/bleeding tendency and swollen glands.  Psychiatric/Behavioral: Negative for depressed mood and sleep disturbance. The patient is not nervous/anxious.     PMFS History:  Patient Active Problem List   Diagnosis Date Noted  . Family history of psoriatic arthritis 03/02/2017  . Psoriasis 03/01/2017  . Psoriatic arthropathy ([place]) 03/01/2017  . High risk medication use 03/01/2017  . History of gastroesophageal reflux (GERD) 03/01/2017  . 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 Arthritis   . Hypertension   . Psoriasis     History reviewed. No pertinent family history. History reviewed. No pertinent surgical history. Social History   Social History Narrative  . No narrative on file     Objective: Vital Signs: BP 128/74 (BP Location: Left Arm, Patient Position: Sitting, Cuff Size: Normal)   Pulse 82   Resp 16   Ht _1  (1.905 m)   Wt 286 lb (129.7 kg)   BMI 35.75 kg/m    Physical Exam  Constitutional: He is oriented to person, place, and time. He appears well-developed and well-nourished.  HENT:  Head: Normocephalic and atraumatic.  Eyes: Pupils are equal, round, and reactive to light. Conjunctivae and EOM are normal.  Neck: Normal range of motion. Neck supple.  Cardiovascular: Normal rate, regular rhythm and normal heart sounds.   Pulmonary/Chest: Effort normal and breath sounds normal.  Abdominal: Soft. Bowel sounds are normal.  Neurological: He is alert and oriented to person, place, and time.  Skin: Skin is warm and dry. Capillary refill takes less than 2 seconds. Rash noted.  Few scales on bilateral elbows were noted.  Psychiatric: He has a normal mood and affect. His behavior is normal.  Nursing note and vitals reviewed.     Musculoskeletal Exam: C-spine and thoracic lumbar spine good range of motion. He has some tenderness over left SI joint. Shoulder joints, elbow joints, wrist joints, MCPs PIPs DIPs with good range of motion. No synovitis was noted. He has some thickening of PIP/DIP joints due to underlying osteoarthritis. Hip joints knee joints are good range of motion. He has some coming off his left ankle joint which she relates to prior injury. There was no evidence of plantar fasciitis or  Achillis tendinitis.  CDAI Exam: CDAI Homunculus Exam:   Tenderness:  LLE: tibiotalar  Joint Counts:  CDAI Tender Joint count: 0 CDAI Swollen Joint count: 0  Global Assessments:  Patient Global Assessment: 4 Provider Global Assessment: 4  CDAI Calculated Score: 8    Investigation: No additional findings.TB Gold: Negative 01/2017 CBC Latest Ref Rng & Units 01/26/2017  WBC 3.8 - 10.8 K/uL 8.8  Hemoglobin 13.2 - 17.1 g/dL 15.8  Hematocrit 38.5 - 50.0 % 47.0  Platelets 140 - 400 K/uL 343   CMP Latest Ref Rng & Units 01/26/2017  Glucose 65 - 99 mg/dL 83  BUN 7 - 25 mg/dL 22  Creatinine 0.60 - 1.35 mg/dL 0.92  Sodium 135 - 146 mmol/L 141  Potassium 3.5 - 5.3 mmol/L 4.9  Chloride 98 - 110 mmol/L 104  CO2 20 - 31 mmol/L 24  Calcium 8.6 - 10.3 mg/dL 9.8  Total Protein 6.1 - 8.1 g/dL 7.3  Total Bilirubin 0.2 - 1.2 mg/dL 0.7  Alkaline Phos 40 - 115 U/L 81  AST 10 - 40 U/L 18  ALT 9 - 46 U/L 25    Imaging: No results found.  Speciality Comments: No specialty comments available.    Procedures:  No procedures performed Allergies: Patient has no known allergies.   Assessment / Plan:     Visit Diagnoses: Psoriatic arthropathy ([place]) - History of arthritis, epicondylitis, plantar fasciitis, sacroiliitis and psoriasis. He is clinically doing much better. He has no synovitis on examination today. The plantar fasciitis and epicondylitis is resolved. He still have some SI joint discomfort. He has taken  methotrexate in the past but was discontinued due to inadequate response. We discussed the option of adding methotrexate to Humira. He was in agreement. A handout was given and informed consent was taken. My plan is to start him on methotrexate 4 tablets by mouth every week after lab results. If labs are normal we will increase it to 6 tablets and then 8 tablets by mouth every week. He will need lab work every 2 weeks 3 and then every 2 months to monitor for drug toxicity if labs are stable then the labs could be repeated every 3 months. He will need folic acid 2 mg by mouth daily.  Other psoriasis: He has mild residual dry skin over his elbows but no active rash today.  High risk medication use - Humira 40 mg subcutaneous q o wk started 01/26/2017. (Methotrexate -inadequate response),  - Plan: CBC with Differential/Platelet, CMP14+EGFR. His last LFTs were mildly elevated in August which were done at his PCPs office. Patient believes it was related to use of Aleve. Since then he has stopped Aleve. I will check his labs today. If his labs are normal we can initiate  methotrexate.  Drug Counseling   Contraception: Patient's wife had hysterectomy.  Alcohol use: He does not drink any alcohol.  Patient was counseled on the purpose, proper use, and adverse effects of methotrexate including nausea, infection, and signs and symptoms of pneumonitis.  Reviewed instructions with patient to take methotrexate weekly along with folic acid daily.  Discussed the importance of frequent monitoring of kidney and liver function and blood counts, and provided patient with standing lab instructions.  Counseled patient to avoid NSAIDs and alcohol while on methotrexate.  Provided patient with educational materials on methotrexate and answered all questions.  Advised patient to get annual influenza vaccine and to get a pneumococcal vaccine if patient has not already had one.  Patient voiced understanding.  Patient consented to  methotrexate use.  Will upload into chart.    Myalgia: He complains of some quadriceps pain. I believe it may be related to his work.  Chronic SI joint pain: He has some discomfort in his left SI joint.  History of gastroesophageal reflux (GERD): Symptoms are controlled.  Family history of psoriatic arthritis - in his mother     Orders: Orders Placed This Encounter  Procedures  . CBC with Differential/Platelet  . COMPLETE METABOLIC PANEL WITH GFR   No orders of the defined types were placed in this encounter.   Face-to-face time spent with patient was 30 minutes. Greater than 50% of time was spent in counseling and coordination of care.  Follow-Up Instructions: Return in about 3 months (around 09/07/2017) for Psoriatic arthritis Psoriasis.   [name], MD  Note - This record has been created using Editor, commissioning.  Chart creation errors have been sought, but may not always  have been located. Such creation errors do not reflect on  the standard of medical care.

## 2017-06-07 ENCOUNTER — Ambulatory Visit (INDEPENDENT_AMBULATORY_CARE_PROVIDER_SITE_OTHER): Payer: BLUE CROSS/BLUE SHIELD | Admitting: Rheumatology

## 2017-06-07 ENCOUNTER — Encounter: Payer: Self-pay | Admitting: Rheumatology

## 2017-06-07 VITALS — BP 128/74 | HR 82 | Resp 16 | Ht 75.0 in | Wt 286.0 lb

## 2017-06-07 DIAGNOSIS — Z8719 Personal history of other diseases of the digestive system: Secondary | ICD-10-CM | POA: Diagnosis not present

## 2017-06-07 DIAGNOSIS — Z8261 Family history of arthritis: Secondary | ICD-10-CM | POA: Diagnosis not present

## 2017-06-07 DIAGNOSIS — Z79899 Other long term (current) drug therapy: Secondary | ICD-10-CM | POA: Diagnosis not present

## 2017-06-07 DIAGNOSIS — Z840 Family history of diseases of the skin and subcutaneous tissue: Secondary | ICD-10-CM

## 2017-06-07 DIAGNOSIS — L4050 Arthropathic psoriasis, unspecified: Secondary | ICD-10-CM | POA: Diagnosis not present

## 2017-06-07 DIAGNOSIS — G8929 Other chronic pain: Secondary | ICD-10-CM

## 2017-06-07 DIAGNOSIS — L408 Other psoriasis: Secondary | ICD-10-CM

## 2017-06-07 DIAGNOSIS — M533 Sacrococcygeal disorders, not elsewhere classified: Secondary | ICD-10-CM

## 2017-06-07 LAB — COMPLETE METABOLIC PANEL WITH GFR
AG RATIO: 1.8 (calc) (ref 1.0–2.5)
ALT: 36 U/L (ref 9–46)
AST: 22 U/L (ref 10–40)
Albumin: 4.6 g/dL (ref 3.6–5.1)
Alkaline phosphatase (APISO): 70 U/L (ref 40–115)
BILIRUBIN TOTAL: 0.4 mg/dL (ref 0.2–1.2)
BUN: 18 mg/dL (ref 7–25)
CHLORIDE: 104 mmol/L (ref 98–110)
CO2: 28 mmol/L (ref 20–32)
Calcium: 9.3 mg/dL (ref 8.6–10.3)
Creat: 0.93 mg/dL (ref 0.60–1.35)
GFR, EST AFRICAN AMERICAN: 114 mL/min/{1.73_m2} (ref >=60)
GFR, Est Non African American: 99 mL/min/{1.73_m2} (ref >=60)
GLOBULIN: 2.5 g/dL (ref 1.9–3.7)
Glucose, Bld: 67 mg/dL (ref 65–99)
POTASSIUM: 4.2 mmol/L (ref 3.5–5.3)
SODIUM: 140 mmol/L (ref 135–146)
TOTAL PROTEIN: 7.1 g/dL (ref 6.1–8.1)

## 2017-06-07 LAB — CBC WITH DIFFERENTIAL/PLATELET
BASOS PCT: 0.7 %
Basophils Absolute: 79 cells/uL (ref 0–200)
EOS ABS: 192 {cells}/uL (ref 15–500)
Eosinophils Relative: 1.7 %
HCT: 42.3 % (ref 38.5–50.0)
Hemoglobin: 14.6 g/dL (ref 13.2–17.1)
Lymphs Abs: 2938 cells/uL (ref 850–3900)
MCH: 30.4 pg (ref 27.0–33.0)
MCHC: 34.5 g/dL (ref 32.0–36.0)
MCV: 88.1 fL (ref 80.0–100.0)
MPV: 9.5 fL (ref 7.5–12.5)
Monocytes Relative: 11.9 %
NEUTROS ABS: 6746 {cells}/uL (ref 1500–7800)
NEUTROS PCT: 59.7 %
PLATELETS: 355 10*3/uL (ref 140–400)
RBC: 4.8 10*6/uL (ref 4.20–5.80)
RDW: 12.7 % (ref 11.0–15.0)
TOTAL LYMPHOCYTE: 26 %
WBC mixed population: 1345 cells/uL — ABNORMAL HIGH (ref 200–950)
WBC: 11.3 10*3/uL — AB (ref 3.8–10.8)

## 2017-06-07 NOTE — Patient Instructions (Signed)
Standing Labs We placed an order today for your standing lab work.    Please come back and get your standing labs in January and every 3 months  We have open lab Monday through Friday from 8:30-11:30 AM and 1:30-4 PM at the office of Dr. Pollyann Savoy.   The office is located at [address], [place], [place] [address] No appointment is necessary.   Labs are drawn by [facility].  You may receive a bill from [facility] for your lab work. If you have any questions regarding directions or hours of operation,  please call [phone].   Methotrexate tablets What is this medicine? METHOTREXATE (METH oh TREX ate) is a chemotherapy drug used to treat cancer including breast cancer, leukemia, and lymphoma. This medicine can also be used to treat psoriasis and certain kinds of arthritis. This medicine may be used for other purposes; ask your health care provider or pharmacist if you have questions. COMMON BRAND NAME(S): Rheumatrex, Trexall What should I tell my health care provider before I take this medicine? They need to know if you have any of these conditions: -fluid in the stomach area or lungs -if you often drink alcohol -infection or immune system problems -kidney disease or on hemodialysis -liver disease -low blood counts, like low white cell, platelet, or red cell counts -lung disease -radiation therapy -stomach ulcers -ulcerative colitis -an unusual or allergic reaction to methotrexate, other medicines, foods, dyes, or preservatives -pregnant or trying to get pregnant -breast-feeding How should I use this medicine? Take this medicine by mouth with a glass of water. Follow the directions on the prescription label. Take your medicine at regular intervals. Do not take it more often than directed. Do not stop taking except on your doctor's advice. Make sure you know why you are taking this medicine and how often you should take it. If this medicine is used for a condition that  is not cancer, like arthritis or psoriasis, it should be taken weekly, NOT daily. Taking this medicine more often than directed can cause serious side effects, even death. Talk to your healthcare provider about safe handling and disposal of this medicine. You may need to take special precautions. Talk to your pediatrician regarding the use of this medicine in children. While this drug may be prescribed for selected conditions, precautions do apply. Overdosage: If you think you have taken too much of this medicine contact a poison control center or emergency room at once. NOTE: This medicine is only for you. Do not share this medicine with others. What if I miss a dose? If you miss a dose, talk with your doctor or health care professional. Do not take double or extra doses. What may interact with this medicine? This medicine may interact with the following medication: -acitretin -aspirin and aspirin-like medicines including salicylates -azathioprine -certain antibiotics like penicillins, tetracycline, and chloramphenicol -cyclosporine -gold -hydroxychloroquine -live virus vaccines -NSAIDs, medicines for pain and inflammation, like ibuprofen or naproxen -other cytotoxic agents -penicillamine -phenylbutazone -phenytoin -probenecid -retinoids such as isotretinoin and tretinoin -steroid medicines like prednisone or cortisone -sulfonamides like sulfasalazine and trimethoprim/sulfamethoxazole -theophylline This list may not describe all possible interactions. Give your health care provider a list of all the medicines, herbs, non-prescription drugs, or dietary supplements you use. Also tell them if you smoke, drink alcohol, or use illegal drugs. Some items may interact with your medicine. What should I watch for while using this medicine? Avoid alcoholic drinks. This medicine can make you more sensitive to the  sun. Keep out of the sun. If you cannot avoid being in the sun, wear protective  clothing and use sunscreen. Do not use sun lamps or tanning beds/booths. You may need blood work done while you are taking this medicine. Call your doctor or health care professional for advice if you get a fever, chills or sore throat, or other symptoms of a cold or flu. Do not treat yourself. This drug decreases your body's ability to fight infections. Try to avoid being around people who are sick. This medicine may increase your risk to bruise or bleed. Call your doctor or health care professional if you notice any unusual bleeding. Check with your doctor or health care professional if you get an attack of severe diarrhea, nausea and vomiting, or if you sweat a lot. The loss of too much body fluid can make it dangerous for you to take this medicine. Talk to your doctor about your risk of cancer. You may be more at risk for certain types of cancers if you take this medicine. Both men and women must use effective birth control with this medicine. Do not become pregnant while taking this medicine or until at least 1 normal menstrual cycle has occurred after stopping it. Women should inform their doctor if they wish to become pregnant or think they might be pregnant. Men should not father a child while taking this medicine and for 3 months after stopping it. There is a potential for serious side effects to an unborn child. Talk to your health care professional or pharmacist for more information. Do not breast-feed an infant while taking this medicine. What side effects may I notice from receiving this medicine? Side effects that you should report to your doctor or health care professional as soon as possible: -allergic reactions like skin rash, itching or hives, swelling of the face, lips, or tongue -breathing problems or shortness of breath -diarrhea -dry, nonproductive cough -low blood counts - this medicine may decrease the number of white blood cells, red blood cells and platelets. You may be at  increased risk for infections and bleeding. -mouth sores -redness, blistering, peeling or loosening of the skin, including inside the mouth -signs of infection - fever or chills, cough, sore throat, pain or trouble passing urine -signs and symptoms of bleeding such as bloody or black, tarry stools; red or dark-brown urine; spitting up blood or brown material that looks like coffee grounds; red spots on the skin; unusual bruising or bleeding from the eye, gums, or nose -signs and symptoms of kidney injury like trouble passing urine or change in the amount of urine -signs and symptoms of liver injury like dark yellow or brown urine; general ill feeling or flu-like symptoms; light-colored stools; loss of appetite; nausea; right upper belly pain; unusually weak or tired; yellowing of the eyes or skin Side effects that usually do not require medical attention (report to your doctor or health care professional if they continue or are bothersome): -dizziness -hair loss -tiredness -upset stomach -vomiting This list may not describe all possible side effects. Call your doctor for medical advice about side effects. You may report side effects to FDA at 1-800-FDA-1088. Where should I keep my medicine? Keep out of the reach of children. Store at room temperature between 20 and 25 degrees C (68 and 77 degrees F). Protect from light. Throw away any unused medicine after the expiration date. NOTE: This sheet is a summary. It may not cover all possible information. If you have questions  about this medicine, talk to your doctor, pharmacist, or health care provider.  2018 Elsevier/Gold Standard (2015-04-14 05:39:22)

## 2017-06-08 ENCOUNTER — Telehealth: Payer: Self-pay

## 2017-06-08 NOTE — Telephone Encounter (Signed)
Patient called back and stated that he received the message for his lab results.  Would like to know about starting MTX?  Cb# is [phone].  Please advise.  Thank You.

## 2017-06-08 NOTE — Progress Notes (Signed)
Stable. Mild elevation of WBC count. We will monitor.

## 2017-06-09 MED ORDER — FOLIC ACID 1 MG PO TABS
2.0000 mg | ORAL_TABLET | Freq: Every day | ORAL | 3 refills | Status: DC
Start: 2017-06-09 — End: 2018-04-12

## 2017-06-09 MED ORDER — METHOTREXATE 2.5 MG PO TABS: ORAL_TABLET | ORAL | 2 refills | Status: DC

## 2017-06-09 NOTE — Addendum Note (Signed)
Addended by: Henriette CombsHATTON, ANDREA L on: 06/09/2017 10:29 AM   Modules accepted: Orders

## 2017-06-09 NOTE — Telephone Encounter (Signed)
Prescriptions sent to the pharmacy and patient aware.

## 2017-06-28 ENCOUNTER — Telehealth: Payer: Self-pay | Admitting: Rheumatology

## 2017-06-28 LAB — CMP14+EGFR
A/G RATIO: 2 (ref 1.2–2.2)
ALT: 49 IU/L — ABNORMAL HIGH (ref 0–44)
AST: 29 IU/L (ref 0–40)
Albumin: 4.5 g/dL (ref 3.5–5.5)
Alkaline Phosphatase: 74 IU/L (ref 39–117)
BUN/Creatinine Ratio: 19 (ref 9–20)
BUN: 17 mg/dL (ref 6–24)
Bilirubin Total: 0.4 mg/dL (ref 0.0–1.2)
CO2: 23 mmol/L (ref 20–29)
CREATININE: 0.89 mg/dL (ref 0.76–1.27)
Calcium: 9.3 mg/dL (ref 8.7–10.2)
Chloride: 104 mmol/L (ref 96–106)
GFR calc Af Amer: 119 mL/min/{1.73_m2} (ref >59)
GFR, EST NON AFRICAN AMERICAN: 103 mL/min/{1.73_m2} (ref >59)
GLOBULIN, TOTAL: 2.3 g/dL (ref 1.5–4.5)
Glucose: 94 mg/dL (ref 65–99)
Potassium: 4.1 mmol/L (ref 3.5–5.2)
SODIUM: 144 mmol/L (ref 134–144)
Total Protein: 6.8 g/dL (ref 6.0–8.5)

## 2017-06-28 LAB — CBC WITH DIFFERENTIAL/PLATELET
BASOS ABS: 0.1 10*3/uL (ref 0.0–0.2)
Basos: 1 %
EOS (ABSOLUTE): 0.2 10*3/uL (ref 0.0–0.4)
EOS: 2 %
HEMATOCRIT: 45.2 % (ref 37.5–51.0)
Hemoglobin: 15.2 g/dL (ref 13.0–17.7)
Immature Grans (Abs): 0 10*3/uL (ref 0.0–0.1)
Immature Granulocytes: 0 %
LYMPHS ABS: 2.4 10*3/uL (ref 0.7–3.1)
Lymphs: 30 %
MCH: 31.1 pg (ref 26.6–33.0)
MCHC: 33.6 g/dL (ref 31.5–35.7)
MCV: 92 fL (ref 79–97)
MONOS ABS: 0.9 10*3/uL (ref 0.1–0.9)
Monocytes: 11 %
Neutrophils Absolute: 4.4 10*3/uL (ref 1.4–7.0)
Neutrophils: 56 %
PLATELETS: 364 10*3/uL (ref 150–379)
RBC: 4.89 x10E6/uL (ref 4.14–5.80)
RDW: 14.2 % (ref 12.3–15.4)
WBC: 7.8 10*3/uL (ref 3.4–10.8)

## 2017-06-28 NOTE — Telephone Encounter (Signed)
Patient calling you back. Please call him tomorrow Wednesday, 11/7 after 4 pm.

## 2017-06-28 NOTE — Progress Notes (Signed)
Mild elevation of ALT . We will monitor for now.

## 2017-06-29 NOTE — Telephone Encounter (Signed)
Spoke with patient and advised of lab results

## 2017-08-10 ENCOUNTER — Other Ambulatory Visit: Payer: Self-pay | Admitting: Rheumatology

## 2017-08-10 NOTE — Telephone Encounter (Signed)
Last Visit: 06/07/17 Next Visit: due January 2019. Message sent to the front to schedule patient  Labs: 06/07/17 Stable. Mild elevation of WBC count. Mild elevation of ALT. We will monitor TB Gold: 01/26/17 Neg   Okay to refill per Dr. Corliss Skainseveshwar

## 2017-08-19 NOTE — Progress Notes (Signed)
Office Visit Note  Patient: Andrew Chapman             Date of Birth: 1971-03-08           MRN: [id number]             PCP: Emelda Fear, DO Referring: Emelda Fear, DO Visit Date: 08/31/2017 Occupation: @GUAROCC @    Subjective:  Joint stiffness.   History of Present Illness: Andrew Chapman is a 46 y.o. male with history of psoriatic arthritis and psoriasis. He states he has noticed improvement in his symptoms since [place] added methotrexate to Humira. He has not had any rash since the last visit. He still has some joint stiffness but no joint swelling. The epicondylitis and plantar fasciitis has resolved. He does have some SI joint pain off and on. He was taking methotrexate 4 tablets for 2 weeks, 6 tablets for 2 weeks and then 8 tablets for 2 weeks. For his refill he had same order instead of increased dose of 8 tablets per week. He does experience some discomfort with the weather change.  Activities of Daily Living:  Patient reports morning stiffness for 0 minute.   Patient Denies nocturnal pain.  Difficulty dressing/grooming: Denies Difficulty climbing stairs: Denies Difficulty getting out of chair: Denies Difficulty using hands for taps, buttons, cutlery, and/or writing: Denies   Review of Systems  Constitutional: Negative for fatigue, night sweats and weakness ( ).  HENT: Negative for mouth sores, mouth dryness and nose dryness.   Eyes: Negative for redness and dryness.  Respiratory: Negative for shortness of breath and difficulty breathing.   Cardiovascular: Negative for chest pain, palpitations, hypertension, irregular heartbeat and swelling in legs/feet.  Gastrointestinal: Negative for constipation and diarrhea.  Endocrine: Negative for increased urination.  Musculoskeletal: Positive for arthralgias and joint pain. Negative for joint swelling, myalgias, muscle weakness, morning stiffness, muscle tenderness and myalgias.  Skin: Negative for color change, rash, hair loss,  nodules/bumps, skin tightness, ulcers and sensitivity to sunlight.  Allergic/Immunologic: Negative for susceptible to infections.  Neurological: Negative for dizziness, fainting, memory loss and night sweats.  Hematological: Negative for swollen glands.  Psychiatric/Behavioral: Negative for depressed mood and sleep disturbance. The patient is not nervous/anxious.     PMFS History:  Patient Active Problem List   Diagnosis Date Noted  . Family history of psoriatic arthritis 03/02/2017  . Psoriasis 03/01/2017  . Psoriatic arthropathy ([place]) 03/01/2017  . High risk medication use 03/01/2017  . History of gastroesophageal reflux (GERD) 03/01/2017  . 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 Arthritis   . Hypertension   . Psoriasis     History reviewed. No pertinent family history. History reviewed. No pertinent surgical history. Social History   Social History Narrative  . Not on file     Objective: Vital Signs: BP 126/72 (BP Location: Left Arm, Patient Position: Sitting, Cuff Size: Normal)   Resp 18   Ht 6' 3"  (1.905 m)   Wt 296 lb (134.3 kg)   BMI 37.00 kg/m    Physical Exam  Constitutional: He is oriented to person, place, and time. He appears well-developed and well-nourished.  HENT:  Head: Normocephalic and atraumatic.  Eyes: Conjunctivae and EOM are normal. Pupils are equal, round, and reactive to light.  Neck: Normal range of motion. Neck supple.  Cardiovascular: Normal rate, regular rhythm and normal heart sounds.  Pulmonary/Chest: Effort normal and breath sounds normal.  Abdominal: Soft. Bowel sounds are normal.  Neurological: He is alert and oriented to person, place, and time.  Skin: Skin is warm and dry. Capillary refill takes less than 2 seconds.  Psychiatric: He has a normal mood and affect. His behavior is normal.  Nursing note and vitals reviewed.    Musculoskeletal Exam: C-spine and thoracic lumbar spine good range  of motion. Shoulder joints elbow joints wrist joint MCPs PIPs DIPs with good range of motion with no synovitis. Hip joints knee joints ankles MTPs PIPs DIPs with good range of motion with no synovitis. No SI joint tenderness was noted.  CDAI Exam: CDAI Homunculus Exam:   Joint Counts:  CDAI Tender Joint count: 0 CDAI Swollen Joint count: 0  Global Assessments:  Patient Global Assessment: 5 Provider Global Assessment: 3  CDAI Calculated Score: 8    Investigation: No additional findings.Tb Gold: 01/26/2017 Negative  CBC Latest Ref Rng & Units 06/27/2017 06/07/2017 01/26/2017  WBC 3.4 - 10.8 x10E3/uL 7.8 11.3(H) 8.8  Hemoglobin 13.0 - 17.7 g/dL 15.2 14.6 15.8  Hematocrit 37.5 - 51.0 % 45.2 42.3 47.0  Platelets 150 - 379 x10E3/uL 364 355 343   CMP Latest Ref Rng & Units 06/27/2017 06/07/2017 01/26/2017  Glucose 65 - 99 mg/dL 94 67 83  BUN 6 - 24 mg/dL 17 18 22   Creatinine 0.76 - 1.27 mg/dL 0.89 0.93 0.92  Sodium 134 - 144 mmol/L 144 140 141  Potassium 3.5 - 5.2 mmol/L 4.1 4.2 4.9  Chloride 96 - 106 mmol/L 104 104 104  CO2 20 - 29 mmol/L 23 28 24   Calcium 8.7 - 10.2 mg/dL 9.3 9.3 9.8  Total Protein 6.0 - 8.5 g/dL 6.8 7.1 7.3  Total Bilirubin 0.0 - 1.2 mg/dL 0.4 0.4 0.7  Alkaline Phos 39 - 117 IU/L 74 - 81  AST 0 - 40 IU/L 29 22 18   ALT 0 - 44 IU/L 49(H) 36 25    Imaging: No results found.  Speciality Comments: No specialty comments available.    Procedures:  No procedures performed Allergies: Patient has no known allergies.   Assessment / Plan:     Visit Diagnoses: Psoriatic arthropathy ([place]) - History of arthritis, epicondylitis, plantar fasciitis, sacroiliitis and psoriasis. History much better on combination of Humira and methotrexate. He was taking inadequate dose of methotrexate. I've advised him to increase methotrexate to 8 tablets by mouth every week. We will check labs today.  Psoriasis: He has no active lesions of psoriasis.  High risk medication use - Humira 40  mg subcutaneous q o wk started 01/26/2017, MTX 8 tabs po qwk and Folic acid 6m po qd - Plan: CBC with Differential/Platelet, COMPLETE METABOLIC PANEL WITH GFR, COZY24+MGNO CBC with Differential/Platelet, CMP14+EGFR, CBC with Differential/Platelet labs to be checked today and then every 3 months.  History of gastroesophageal reflux (GERD)  Chronic SI joint pain: He has intermittent SI joint pain overall history much better.  Family history of psoriatic arthritis - in his mother.   Class 2 obesity due to excess calories without serious comorbidity with body mass index (BMI) of 37.0 to 37.9 in adult : Weight loss diet and exercise was discussed.  Association of heart disease with psoriatic arthritis was discussed. Need to monitor blood pressure, cholesterol, and to exercise 30-60 minutes on daily basis was discussed.  Orders: Orders Placed This Encounter  Procedures  . CBC with Differential/Platelet  . COMPLETE METABOLIC PANEL WITH GFR   Meds ordered this encounter  Medications  . methotrexate (RHEUMATREX) 2.5 MG tablet    Sig:  8 tablets by mouth every week    Dispense:  96 tablet    Refill:  0    Face-to-face time spent with patient was 30 minutes.Greater than 50% of time was spent in counseling and coordination of care.  Follow-Up Instructions: Return in about 4 months (around 12/29/2017) for Psoriatic arthritis.   [name], MD  Note - This record has been created using Editor, commissioning.  Chart creation errors have been sought, but may not always  have been located. Such creation errors do not reflect on  the standard of medical care.

## 2017-08-31 ENCOUNTER — Ambulatory Visit (INDEPENDENT_AMBULATORY_CARE_PROVIDER_SITE_OTHER): Payer: BLUE CROSS/BLUE SHIELD | Admitting: Rheumatology

## 2017-08-31 ENCOUNTER — Encounter: Payer: Self-pay | Admitting: Rheumatology

## 2017-08-31 VITALS — BP 126/72 | Resp 18 | Ht 75.0 in | Wt 296.0 lb

## 2017-08-31 DIAGNOSIS — L4050 Arthropathic psoriasis, unspecified: Secondary | ICD-10-CM

## 2017-08-31 DIAGNOSIS — L409 Psoriasis, unspecified: Secondary | ICD-10-CM

## 2017-08-31 DIAGNOSIS — Z840 Family history of diseases of the skin and subcutaneous tissue: Secondary | ICD-10-CM

## 2017-08-31 DIAGNOSIS — Z8719 Personal history of other diseases of the digestive system: Secondary | ICD-10-CM

## 2017-08-31 DIAGNOSIS — Z8261 Family history of arthritis: Secondary | ICD-10-CM | POA: Diagnosis not present

## 2017-08-31 DIAGNOSIS — Z6837 Body mass index (BMI) 37.0-37.9, adult: Secondary | ICD-10-CM | POA: Diagnosis not present

## 2017-08-31 DIAGNOSIS — Z79899 Other long term (current) drug therapy: Secondary | ICD-10-CM | POA: Diagnosis not present

## 2017-08-31 DIAGNOSIS — G8929 Other chronic pain: Secondary | ICD-10-CM

## 2017-08-31 DIAGNOSIS — E6609 Other obesity due to excess calories: Secondary | ICD-10-CM

## 2017-08-31 DIAGNOSIS — M533 Sacrococcygeal disorders, not elsewhere classified: Secondary | ICD-10-CM | POA: Diagnosis not present

## 2017-08-31 LAB — CBC WITH DIFFERENTIAL/PLATELET
Basophils Absolute: 57 cells/uL (ref 0–200)
Basophils Relative: 0.8 %
EOS ABS: 178 {cells}/uL (ref 15–500)
Eosinophils Relative: 2.5 %
HCT: 44.3 % (ref 38.5–50.0)
Hemoglobin: 15.2 g/dL (ref 13.2–17.1)
Lymphs Abs: 2144 cells/uL (ref 850–3900)
MCH: 30.8 pg (ref 27.0–33.0)
MCHC: 34.3 g/dL (ref 32.0–36.0)
MCV: 89.7 fL (ref 80.0–100.0)
MPV: 9 fL (ref 7.5–12.5)
Monocytes Relative: 12.4 %
NEUTROS PCT: 54.1 %
Neutro Abs: 3841 cells/uL (ref 1500–7800)
PLATELETS: 384 10*3/uL (ref 140–400)
RBC: 4.94 10*6/uL (ref 4.20–5.80)
RDW: 12.7 % (ref 11.0–15.0)
TOTAL LYMPHOCYTE: 30.2 %
WBC: 7.1 10*3/uL (ref 3.8–10.8)
WBCMIX: 880 {cells}/uL (ref 200–950)

## 2017-08-31 LAB — COMPLETE METABOLIC PANEL WITH GFR
AG RATIO: 1.8 (calc) (ref 1.0–2.5)
ALKALINE PHOSPHATASE (APISO): 70 U/L (ref 40–115)
ALT: 68 U/L — AB (ref 9–46)
AST: 26 U/L (ref 10–40)
Albumin: 4.6 g/dL (ref 3.6–5.1)
BUN: 20 mg/dL (ref 7–25)
CHLORIDE: 105 mmol/L (ref 98–110)
CO2: 29 mmol/L (ref 20–32)
Calcium: 9.6 mg/dL (ref 8.6–10.3)
Creat: 0.85 mg/dL (ref 0.60–1.35)
GFR, EST AFRICAN AMERICAN: 122 mL/min/{1.73_m2} (ref >=60)
GFR, Est Non African American: 105 mL/min/{1.73_m2} (ref >=60)
Globulin: 2.5 g/dL (calc) (ref 1.9–3.7)
Glucose, Bld: 86 mg/dL (ref 65–99)
Potassium: 4.6 mmol/L (ref 3.5–5.3)
Sodium: 140 mmol/L (ref 135–146)
Total Bilirubin: 0.8 mg/dL (ref 0.2–1.2)
Total Protein: 7.1 g/dL (ref 6.1–8.1)

## 2017-08-31 MED ORDER — METHOTREXATE 2.5 MG PO TABS: ORAL_TABLET | ORAL | 0 refills | Status: DC

## 2017-08-31 NOTE — Patient Instructions (Addendum)
  Please take methotrexate 8 tablets by mouth every week along with folic acid 2 tablets by mouth daily      Standing Labs We placed an order today for your standing lab work.    Please come back and get your standing labs in April and every 3 months  We have open lab Monday through Friday from 8:30-11:30 AM and 1:30-4 PM at the office of Dr. Pollyann SavoyShaili Donovan Gatchel.   The office is located at [address], [address], [place], [place] [address] No appointment is necessary.   Labs are drawn by [facility].  You may receive a bill from [facility] for your lab work. If you have any questions regarding directions or hours of operation,  please call [phone].     Association of heart disease with psoriatic arthritis was discussed. Need to monitor blood pressure, cholesterol, and to exercise 30-60 minutes on daily basis was discussed.

## 2017-09-01 NOTE — Progress Notes (Signed)
Decrease methotrexate to 6 tablets by mouth every week

## 2017-11-01 ENCOUNTER — Other Ambulatory Visit: Payer: Self-pay | Admitting: Rheumatology

## 2017-11-01 NOTE — Telephone Encounter (Signed)
Last Visit: 08/31/17 Next Visit: 12/29/17 Labs:08/31/17 ALT 68 previously 49 TB Gold:  01/26/17 Neg   Okay to refill per Dr. Corliss Skainseveshwar

## 2017-12-20 NOTE — Progress Notes (Signed)
Office Visit Note  Patient: Andrew Chapman             Date of Birth: 04-04-1971           MRN: [id number]             PCP: Lorelei Pont, DO Referring: Lorelei Pont, DO Visit Date: 01/03/2018 Occupation: @    Subjective:  Medication monitoring   History of Present Illness: Kenderick Kobler is a 47 y.o. male with history of cirrhotic arthritis.  He is currently on Humira 40 mg subcutaneous every other week as well as methotrexate 6  tablets by mouth weekly.  Patient denies any recent flares.  He denies any joint pain or joint swelling at this time.  Has any Achilles tendinitis or plantar fasciitis.  He does not have any pain in his SI joints at this time.  He has no active psoriasis.    Activities of Daily Living:  Patient reports morning stiffness for 0 minute.   Patient Denies nocturnal pain.  Difficulty dressing/grooming: Denies Difficulty climbing stairs: Denies Difficulty getting out of chair: Denies Difficulty using hands for taps, buttons, cutlery, and/or writing: Denies   Review of Systems  Constitutional: Negative for fatigue and night sweats.  HENT: Negative for mouth sores, mouth dryness and nose dryness.   Eyes: Negative for redness and dryness.  Respiratory: Negative for cough, hemoptysis, shortness of breath and difficulty breathing.   Cardiovascular: Negative for chest pain, palpitations, hypertension, irregular heartbeat and swelling in legs/feet.  Gastrointestinal: Negative for blood in stool, constipation and diarrhea.  Endocrine: Negative for increased urination.  Genitourinary: Negative for painful urination.  Musculoskeletal: Negative for arthralgias, joint pain, joint swelling, myalgias, muscle weakness, morning stiffness, muscle tenderness and myalgias.  Skin: Negative for color change, rash, hair loss, nodules/bumps, skin tightness, ulcers and sensitivity to sunlight.  Allergic/Immunologic: Negative for susceptible to infections.  Neurological:  Negative for dizziness, fainting, memory loss, night sweats and weakness.  Hematological: Negative for swollen glands.  Psychiatric/Behavioral: Negative for depressed mood and sleep disturbance. The patient is not nervous/anxious.     PMFS History:  Patient Active Problem List   Diagnosis Date Noted  . Family history of psoriatic arthritis 03/02/2017  . Psoriasis 03/01/2017  . Psoriatic arthropathy ([facility]) 03/01/2017  . High risk medication use 03/01/2017  . History of gastroesophageal reflux (GERD) 03/01/2017  . 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 Arthritis   . Hypertension   . Psoriasis     Family History  Problem Relation Age of Onset  . Diabetes Mother    History reviewed. No pertinent surgical history. Social History   Social History Narrative  . Not on file     Objective: Vital Signs: BP 121/74 (BP Location: Left Arm, Patient Position: Sitting, Cuff Size: Large)   Pulse 61   Resp 18   Ht  (1.905 m)   Wt 286 lb (129.7 kg)   BMI 35.75 kg/m    Physical Exam  Constitutional: He is oriented to person, place, and time. He appears well-developed and well-nourished.  HENT:  Head: Normocephalic and atraumatic.  Eyes: Pupils are equal, round, and reactive to light. Conjunctivae and EOM are normal.  Neck: Normal range of motion. Neck supple.  Cardiovascular: Normal rate, regular rhythm and normal heart sounds.  Pulmonary/Chest: Effort normal and breath sounds normal.  Abdominal: Soft. Bowel sounds are normal.  Lymphadenopathy:    He has no cervical adenopathy.  Neurological: He is alert and oriented to person, place, and time.  Skin: Skin is warm and dry. Capillary refill takes less than 2 seconds.  Psychiatric: He has a normal mood and affect. His behavior is normal.  Nursing note and vitals reviewed.    Musculoskeletal Exam: C-spine, thoracic spine, lumbar spine good range of motion.  No midline spinal tenderness.  No  SI joint tenderness.  Shoulder joints, elbow joints, wrist joints, MCPs, PIPs, DIPs good range of motion no synovitis.  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  Hip joints, knee joints, ankle joints, MTPs, PIPs, DIPs good range of motion with no synovitis.  No warmth or effusion of bilateral knees.  He has bilateral knee crepitus.  He has no tenderness of trochanteric bursa.  CDAI Exam: CDAI Homunculus Exam:   Joint Counts:  CDAI Tender Joint count: 0 CDAI Swollen Joint count: 0  Global Assessments:  Patient Global Assessment: 3 Provider Global Assessment: 3  CDAI Calculated Score: 6    Investigation: No additional findings. CBC Latest Ref Rng & Units 08/31/2017 06/27/2017 06/07/2017  WBC 3.8 - 10.8 Thousand/uL 7.1 7.8 11.3(H)  Hemoglobin 13.2 - 17.1 g/dL 16.1 09.6 04.5  Hematocrit 38.5 - 50.0 % 44.3 45.2 42.3  Platelets 140 - 400 Thousand/uL 384 364 355   CMP Latest Ref Rng & Units 08/31/2017 06/27/2017 06/07/2017  Glucose 65 - 99 mg/dL 86 94 67  BUN 7 - 25 mg/dL Creatinine 0.60 - 1.35 mg/dL 4.09 8.11 9.14  Sodium 135 - 146 mmol/L 140 144 140  Potassium 3.5 - 5.3 mmol/L 4.6 4.1 4.2  Chloride 98 - 110 mmol/L 105 104 104  CO2 20 - 32 mmol/L Calcium 8.6 - 10.3 mg/dL 9.6 9.3 9.3  Total Protein 6.1 - 8.1 g/dL 7.1 6.8 7.1  Total Bilirubin 0.2 - 1.2 mg/dL 0.8 0.4 0.4  Alkaline Phos 39 - 117 IU/L - 74 -  AST 10 - 40 U/L ALT 9 - 46 U/L 68(H) 49(H) 36    Imaging: No results found.  Speciality Comments: No specialty comments available.    Procedures:  No procedures performed Allergies: Patient has no known allergies.   Assessment / Plan:     Visit Diagnoses: Psoriatic arthropathy ([facility]CC): He has no active synovitis or dactylitis on exam.  He has not had any recent flares of psoriatic arthritis.  He has no joint pain or joint swelling at this time.  He has no joint stiffness.  He has no tenderness of SI  joints.  He has no Achilles tendinitis or plantar fasciitis.  He will continue injecting Humira 40 mg subcutaneously every other week, methotrexate 6 tablets by mouth weekly and folic acid 2 mg daily.  A refill of methotrexate was sent to the pharmacy today.  Psoriasis: He has no active psoriasis or nail pitting at this time.  High risk medication use - Humira 40 mg subcutaneous q o wk started 01/26/2017, MTX 6 tabs po qwk and Folic acid  po qd. TB gold 01/26/17.  CBC, CMP, and TB gold will be ordered today.- Plan: CBC with Differential/Platelet, COMPLETE METABOLIC PANEL WITH GFR, QuantiFERON-TB Gold Plus  Other medical conditions are listed as follows:  History of gastroesophageal reflux (GERD)  Chronic SI joint pain: He has no SI joint tenderness on exam today.  Family history of psoriatic arthritis    Orders: Orders Placed This Encounter  Procedures  .  CBC with Differential/Platelet  . COMPLETE METABOLIC PANEL WITH GFR  . QuantiFERON-TB Gold Plus   Meds ordered this encounter  Medications  . methotrexate (RHEUMATREX) 2.5 MG tablet    Sig: Take 6 tablets by mouth once every week    Dispense:  72 tablet    Refill:  0     Follow-Up Instructions: Return in about 5 months (around 06/05/2018) for Psoriatic arthritis.   Gearldine Bienenstock, PA-C  Note - This record has been created using Dragon software.  Chart creation errors have been sought, but may not always  have been located. Such creation errors do not reflect on  the standard of medical care.

## 2017-12-29 ENCOUNTER — Ambulatory Visit: Payer: BLUE CROSS/BLUE SHIELD | Admitting: Rheumatology

## 2018-01-03 ENCOUNTER — Ambulatory Visit (INDEPENDENT_AMBULATORY_CARE_PROVIDER_SITE_OTHER): Payer: BLUE CROSS/BLUE SHIELD | Admitting: Physician Assistant

## 2018-01-03 ENCOUNTER — Encounter: Payer: Self-pay | Admitting: Physician Assistant

## 2018-01-03 VITALS — BP 121/74 | HR 61 | Resp 18 | Ht 75.0 in | Wt 286.0 lb

## 2018-01-03 DIAGNOSIS — Z8261 Family history of arthritis: Secondary | ICD-10-CM

## 2018-01-03 DIAGNOSIS — L409 Psoriasis, unspecified: Secondary | ICD-10-CM | POA: Diagnosis not present

## 2018-01-03 DIAGNOSIS — Z79899 Other long term (current) drug therapy: Secondary | ICD-10-CM | POA: Diagnosis not present

## 2018-01-03 DIAGNOSIS — Z8719 Personal history of other diseases of the digestive system: Secondary | ICD-10-CM

## 2018-01-03 DIAGNOSIS — L4050 Arthropathic psoriasis, unspecified: Secondary | ICD-10-CM | POA: Diagnosis not present

## 2018-01-03 DIAGNOSIS — M533 Sacrococcygeal disorders, not elsewhere classified: Secondary | ICD-10-CM | POA: Diagnosis not present

## 2018-01-03 DIAGNOSIS — Z840 Family history of diseases of the skin and subcutaneous tissue: Secondary | ICD-10-CM

## 2018-01-03 DIAGNOSIS — G8929 Other chronic pain: Secondary | ICD-10-CM | POA: Diagnosis not present

## 2018-01-03 MED ORDER — METHOTREXATE 2.5 MG PO TABS: ORAL_TABLET | ORAL | 0 refills | Status: DC

## 2018-01-03 NOTE — Patient Instructions (Signed)
Standing Labs We placed an order today for your standing lab work.    Please come back and get your standing labs in August and every 3 months     Call for refills of Humira and ask for new formulation   We have open lab Monday through Friday from 8:30-11:30 AM and 1:30-4:00 PM  at the office of Dr. Pollyann Savoy.   You may experience shorter wait times on Monday and Friday afternoons. The office is located at [address], [place], [place] [address] No appointment is necessary.   Labs are drawn by [facility].  You may receive a bill from [facility] for your lab work. If you have any questions regarding directions or hours of operation,  please call [phone].

## 2018-01-04 NOTE — Progress Notes (Signed)
Labs are WNL.

## 2018-01-05 LAB — CBC WITH DIFFERENTIAL/PLATELET
Basophils Absolute: 114 cells/uL (ref 0–200)
Basophils Relative: 1.1 %
EOS ABS: 312 {cells}/uL (ref 15–500)
Eosinophils Relative: 3 %
HEMATOCRIT: 41.7 % (ref 38.5–50.0)
Hemoglobin: 14.3 g/dL (ref 13.2–17.1)
LYMPHS ABS: 2153 {cells}/uL (ref 850–3900)
MCH: 31 pg (ref 27.0–33.0)
MCHC: 34.3 g/dL (ref 32.0–36.0)
MCV: 90.3 fL (ref 80.0–100.0)
MPV: 10.3 fL (ref 7.5–12.5)
Monocytes Relative: 10 %
NEUTROS PCT: 65.2 %
Neutro Abs: 6781 cells/uL (ref 1500–7800)
Platelets: 347 10*3/uL (ref 140–400)
RBC: 4.62 10*6/uL (ref 4.20–5.80)
RDW: 12.7 % (ref 11.0–15.0)
TOTAL LYMPHOCYTE: 20.7 %
WBC: 10.4 10*3/uL (ref 3.8–10.8)
WBCMIX: 1040 {cells}/uL — AB (ref 200–950)

## 2018-01-05 LAB — COMPLETE METABOLIC PANEL WITH GFR
AG RATIO: 2.1 (calc) (ref 1.0–2.5)
ALT: 27 U/L (ref 9–46)
AST: 18 U/L (ref 10–40)
Albumin: 4.6 g/dL (ref 3.6–5.1)
Alkaline phosphatase (APISO): 66 U/L (ref 40–115)
BILIRUBIN TOTAL: 0.4 mg/dL (ref 0.2–1.2)
BUN: 17 mg/dL (ref 7–25)
CO2: 27 mmol/L (ref 20–32)
Calcium: 9.2 mg/dL (ref 8.6–10.3)
Chloride: 108 mmol/L (ref 98–110)
Creat: 0.97 mg/dL (ref 0.60–1.35)
GFR, EST AFRICAN AMERICAN: 108 mL/min/{1.73_m2} (ref >=60)
GFR, Est Non African American: 93 mL/min/{1.73_m2} (ref >=60)
GLOBULIN: 2.2 g/dL (ref 1.9–3.7)
Glucose, Bld: 83 mg/dL (ref 65–99)
Potassium: 4 mmol/L (ref 3.5–5.3)
Sodium: 141 mmol/L (ref 135–146)
Total Protein: 6.8 g/dL (ref 6.1–8.1)

## 2018-01-05 LAB — QUANTIFERON-TB GOLD PLUS
NIL: 0.01 [IU]/mL
QUANTIFERON-TB GOLD PLUS: NEGATIVE
TB1-NIL: 0 IU/mL
TB2-NIL: 0 IU/mL

## 2018-01-06 NOTE — Progress Notes (Signed)
TB gold negative

## 2018-01-09 ENCOUNTER — Telehealth: Payer: Self-pay | Admitting: Rheumatology

## 2018-01-09 NOTE — Telephone Encounter (Addendum)
[facility] left a message requesting subscriber ID#, and supervising physician info for patients MTX rx. Please call to advise. Use reference # [id number]

## 2018-01-09 NOTE — Telephone Encounter (Signed)
Contacted pharmacy and provided requested information. They are preparing medication and will contact patient for shipment.

## 2018-01-19 ENCOUNTER — Other Ambulatory Visit: Payer: Self-pay | Admitting: Rheumatology

## 2018-01-19 NOTE — Telephone Encounter (Signed)
Last Visit: 01/03/18 Next Visit: 06/05/18 Labs: 01/03/18 WNL TB Gold: 01/03/18 Neg   Okay to refill per Dr. Antony Odea

## 2018-02-07 IMAGING — DX DG CHEST 2V
2 series · 2 of 2 positions shown · non-contrast
Comparison: None.

CLINICAL DATA: Psoriasis, screening for tuberculosis

EXAM:
CHEST  2 VIEW

[w chest pa]
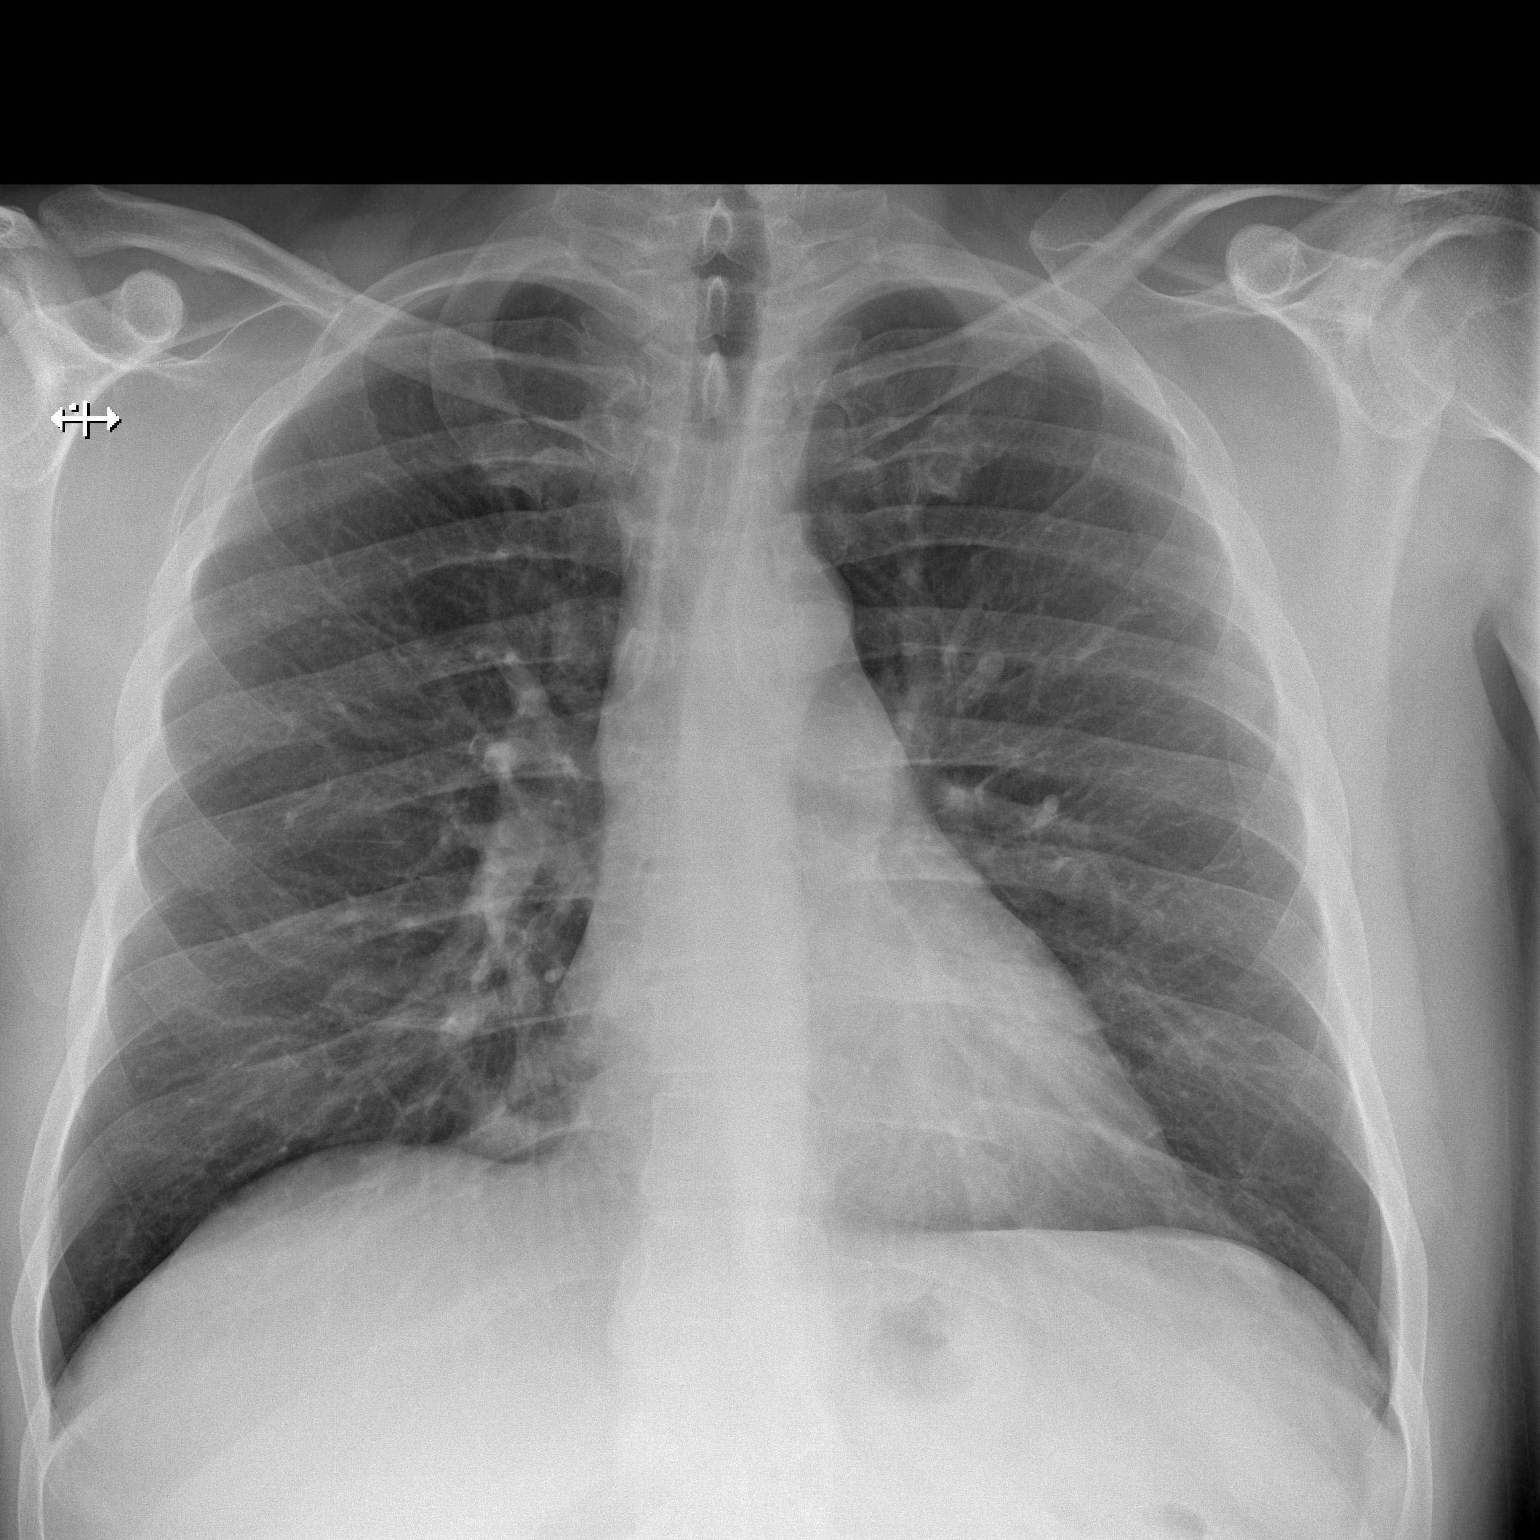

[w chest lat]
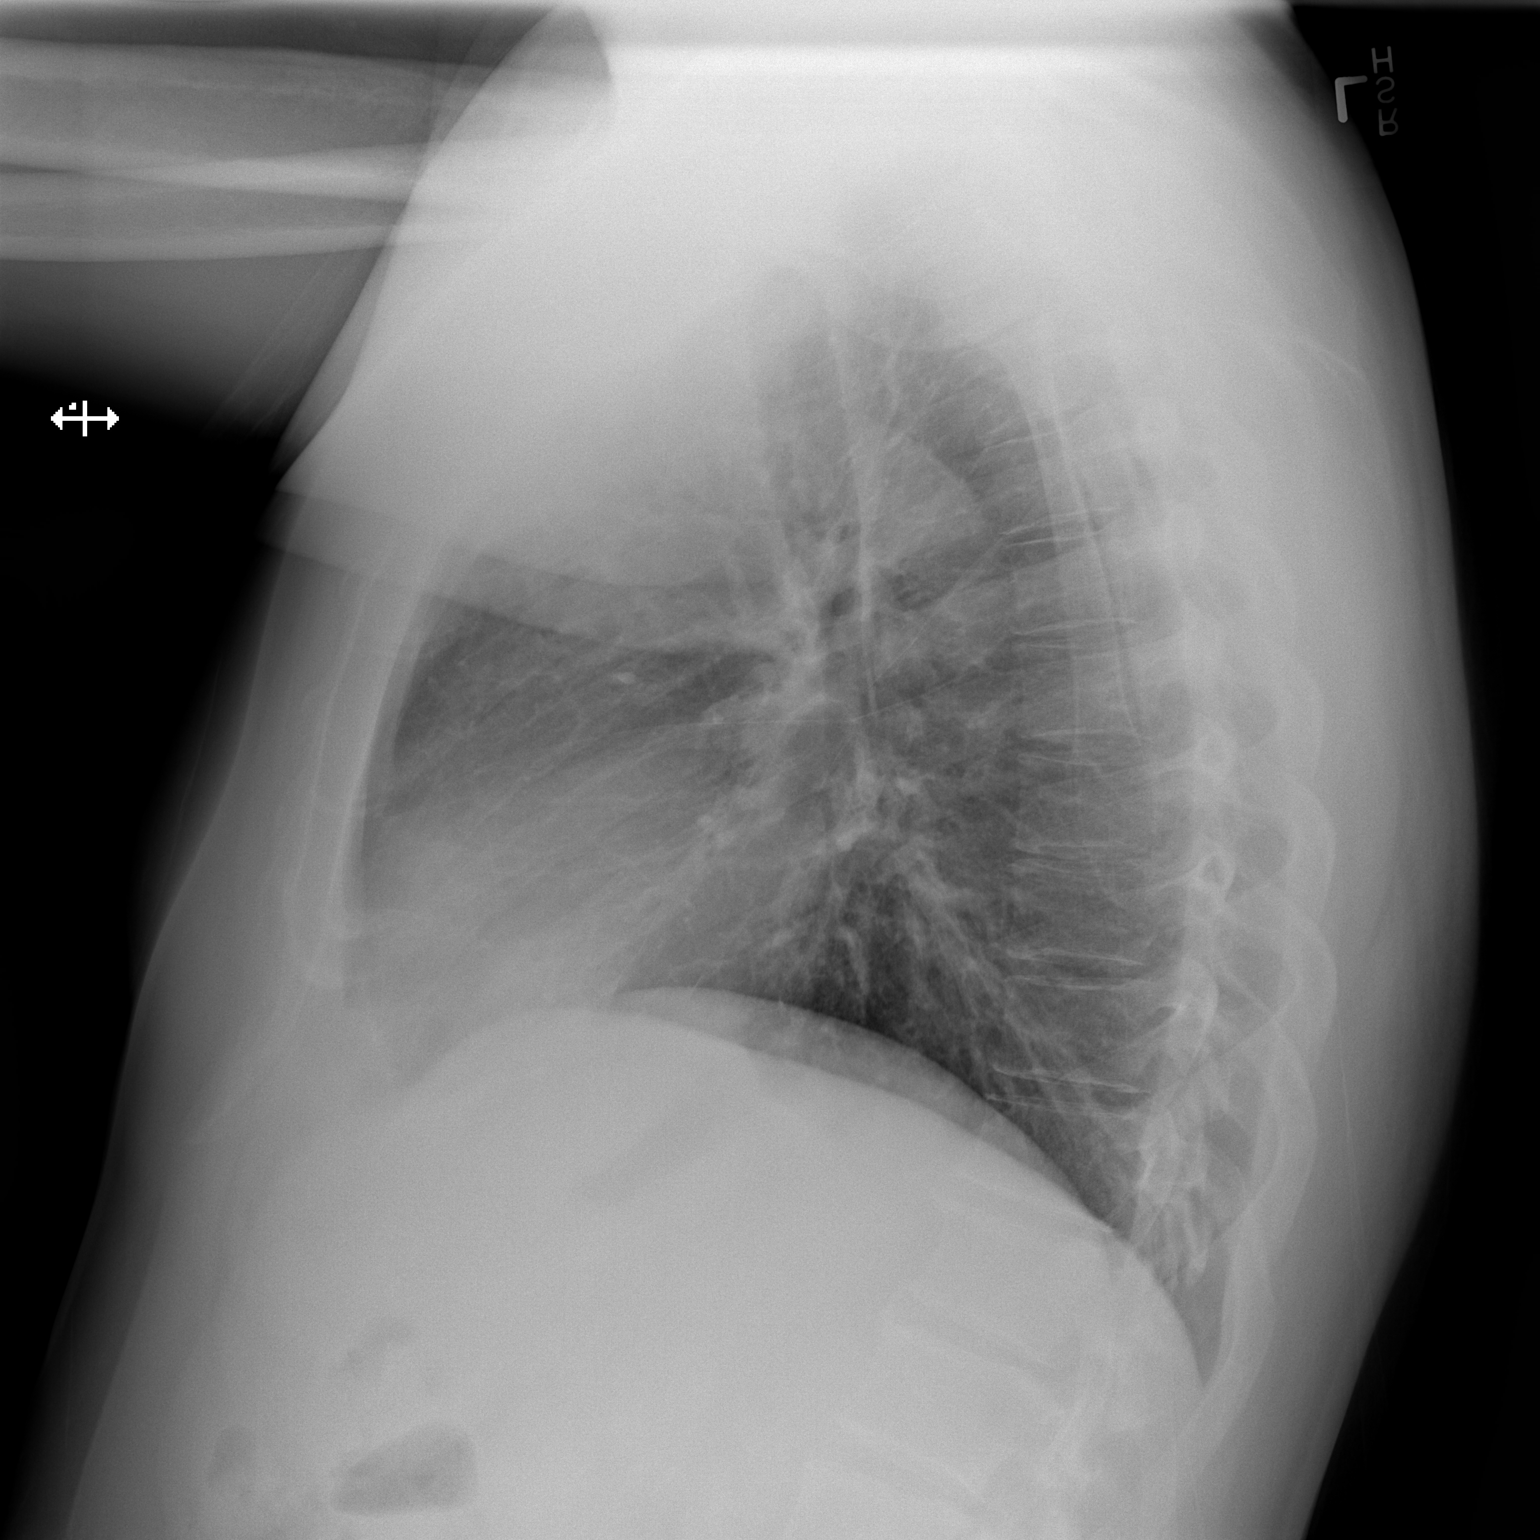

[2 of 2 positions shown; findings below may reference images not displayed]

FINDINGS: The heart size and mediastinal contours are within normal limits.
Both lungs are clear. The visualized skeletal structures are
unremarkable.
IMPRESSION: No active cardiopulmonary disease.

## 2018-04-03 ENCOUNTER — Telehealth: Payer: Self-pay | Admitting: Rheumatology

## 2018-04-03 DIAGNOSIS — Z79899 Other long term (current) drug therapy: Secondary | ICD-10-CM

## 2018-04-03 NOTE — Telephone Encounter (Signed)
Patient called requesting his labwork orders be sent to his PCP Dr. Lorelei PontMark Mahoney.  Patient states he has an appointment with Dr. Leary RocaMahoney on Wednesday, 04/05/18 at 3:45 pm.

## 2018-04-03 NOTE — Telephone Encounter (Signed)
Called Dr. Thomes LollingMahoney's office to clarify which lab they use. I was advised his office uses [facility]. Lab orders have been faxed to [phone], number provided by Charlene.

## 2018-04-06 ENCOUNTER — Telehealth: Payer: Self-pay | Admitting: Rheumatology

## 2018-04-06 NOTE — Telephone Encounter (Signed)
Advised patient's wife the lab orders were faxed to Dr. Thomes LollingMahoney's office on 04/03/2018. She verbalized understanding and will have Dr. Thomes LollingMahoney's office fax over the lab results.

## 2018-04-06 NOTE — Telephone Encounter (Signed)
Patients wife calling requesting lab order to sent to Dr. Thomes LollingMahoney's office directly. Patient has his labs done there in the office. Please call wife with any questions or concerns.

## 2018-04-07 ENCOUNTER — Other Ambulatory Visit: Payer: Self-pay | Admitting: Physician Assistant

## 2018-04-07 NOTE — Telephone Encounter (Signed)
Last visit: 01/03/2018 Next visit: 06/05/2018 Labs: 04/05/2018 WBC 10.9 prev 10.4  Okay to refill per Dr. Corliss Skainseveshwar.

## 2018-04-07 NOTE — Telephone Encounter (Signed)
Ok to refill.  We will continue to monitor.

## 2018-04-11 ENCOUNTER — Other Ambulatory Visit: Payer: Self-pay | Admitting: Rheumatology

## 2018-04-11 NOTE — Telephone Encounter (Addendum)
Last Visit: 01/03/18 Next Visit: 06/05/18 Labs: Labs: 04/05/2018 WBC 10.9 prev 10.4 TB Gold: 01/03/18 Neg   Okay to refill per Dr. Corliss Skainseveshwar

## 2018-04-12 ENCOUNTER — Other Ambulatory Visit: Payer: Self-pay | Admitting: *Deleted

## 2018-04-12 MED ORDER — FOLIC ACID 1 MG PO TABS: 2.0000 mg | ORAL_TABLET | Freq: Every day | ORAL | 3 refills | Status: DC

## 2018-04-12 NOTE — Telephone Encounter (Signed)
Refill request received via fax  Last visit: 01/03/2018 Next visit: 06/05/2018  Okay to refill per Dr. Corliss Skainseveshwar

## 2018-05-22 NOTE — Progress Notes (Deleted)
Office Visit Note  Patient: Andrew Chapman             Date of Birth: 01/21/1971           MRN: [id number]             PCP: Lorelei Pont, DO Referring: Lorelei Pont, DO Visit Date: 06/05/2018 Occupation: @GUAROCC @  Subjective:  No chief complaint on file.   History of Present Illness: Andrew Chapman is a 47 y.o. male ***   Activities of Daily Living:  Patient reports morning stiffness for *** {minute/hour:19697}.   Patient {ACTIONS;DENIES/REPORTS:21021675::"Denies"} nocturnal pain.  Difficulty dressing/grooming: {ACTIONS;DENIES/REPORTS:21021675::"Denies"} Difficulty climbing stairs: {ACTIONS;DENIES/REPORTS:21021675::"Denies"} Difficulty getting out of chair: {ACTIONS;DENIES/REPORTS:21021675::"Denies"} Difficulty using hands for taps, buttons, cutlery, and/or writing: {ACTIONS;DENIES/REPORTS:21021675::"Denies"}  No Rheumatology ROS completed.   PMFS History:  Patient Active Problem List   Diagnosis Date Noted  . Family history of psoriatic arthritis 03/02/2017  . Psoriasis 03/01/2017  . Psoriatic arthropathy (HCC) 03/01/2017  . High risk medication use 03/01/2017  . History of gastroesophageal reflux (GERD) 03/01/2017  . 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 Arthritis   . Hypertension   . Psoriasis     Family History  Problem Relation Age of Onset  . Diabetes Mother    No past surgical history on file. Social History   Social History Narrative  . Not on file    Objective: Vital Signs: There were no vitals taken for this visit.   Physical Exam   Musculoskeletal Exam: ***  CDAI Exam: CDAI Score: Not documented Patient Global Assessment: Not documented; Provider Global Assessment: Not documented Swollen: Not documented; Tender: Not documented Joint Exam   Not documented   There is currently no information documented on the homunculus. Go to the Rheumatology activity and complete the homunculus joint  exam.  Investigation: No additional findings.  Imaging: No results found.  Recent Labs: Lab Results  Component Value Date   WBC 10.4 01/03/2018   HGB 14.3 01/03/2018   PLT 347 01/03/2018   NA 141 01/03/2018   K 4.0 01/03/2018   CL 108 01/03/2018   CO2 27 01/03/2018   GLUCOSE 83 01/03/2018   BUN 17 01/03/2018   CREATININE 0.97 01/03/2018   BILITOT 0.4 01/03/2018   ALKPHOS 74 06/27/2017   AST 18 01/03/2018   ALT 27 01/03/2018   PROT 6.8 01/03/2018   ALBUMIN 4.5 06/27/2017   CALCIUM 9.2 01/03/2018   GFRAA 108 01/03/2018   QFTBGOLDPLUS NEGATIVE 01/03/2018    Speciality Comments: No specialty comments available.  Procedures:  No procedures performed Allergies: Patient has no known allergies.   Assessment / Plan:     Visit Diagnoses: Psoriatic arthropathy (HCC)  Psoriasis  High risk medication use -  Humira 40 mg subcutaneous q o wk started 01/26/2017, MTX 6 tabs po qwk and Folic acid 2mg  po qd.   Chronic SI joint pain  Family history of psoriatic arthritis  History of gastroesophageal reflux (GERD)   Orders: No orders of the defined types were placed in this encounter.  No orders of the defined types were placed in this encounter.   Face-to-face time spent with patient was *** minutes. Greater than 50% of time was spent in counseling and coordination of care.  Follow-Up Instructions: No follow-ups on file.   Gearldine Bienenstock, PA-C  Note - This record has been created using Dragon software.  Chart creation errors have been sought, but may not always  have  been located. Such creation errors do not reflect on  the standard of medical care.

## 2018-05-29 ENCOUNTER — Telehealth: Payer: Self-pay | Admitting: Rheumatology

## 2018-05-29 NOTE — Telephone Encounter (Signed)
Per patient's wife, [employer] needs a new prior auth for Humira. Please call wife to discuss.

## 2018-05-29 NOTE — Telephone Encounter (Signed)
Received a Prior Authorization request from patient's wife for Humira. Authorization has been submitted to patient's insurance via Cover My Meds. Will update once we receive a response.  Called and spoke to wife, patient does not refill again until November. Will let them know when we receive determination from insurance.  1:55 PM Dorthula Nettles, CPhT

## 2018-05-30 NOTE — Telephone Encounter (Signed)
Received notification from [facility] [employer] regarding a prior authorization for Humira. Authorization has been APPROVED from 04/29/18 to 05/29/19.   Will send document to scan center, once received.  Authorization # [id number] Phone # [phone]  Called patient to advise, no answer of voicemail.  12:23 PM Dorthula Nettles, CPhT

## 2018-06-05 ENCOUNTER — Ambulatory Visit: Payer: BLUE CROSS/BLUE SHIELD | Admitting: Physician Assistant

## 2018-06-19 NOTE — Progress Notes (Signed)
Office Visit Note  Patient: Andrew Chapman             Date of Birth: 11-20-1970           MRN: [id number]             PCP: Lorelei Pont, DO Referring: Lorelei Pont, DO Visit Date: 06/28/2018 Occupation: @GUAROCC @  Subjective Medication management.    History of Present Illness: Andrew Chapman is a 47 y.o. male with history of psoriatic arthritis and psoriasis.  He states he has done quite well on combination of Humira and methotrexate.  He has occasional discomfort in his joints but no joint swelling.  He has no rash from psoriasis currently.  He denies any discomfort in SI joints today.  Activities of Daily Living:  Patient reports morning stiffness for 0 minute.   Patient Denies nocturnal pain.  Difficulty dressing/grooming: Denies Difficulty climbing stairs: Denies Difficulty getting out of chair: Denies Difficulty using hands for taps, buttons, cutlery, and/or writing: Denies  Review of Systems  Constitutional: Negative for fatigue and night sweats.  HENT: Negative for mouth sores, mouth dryness and nose dryness.   Eyes: Negative for redness and dryness.  Respiratory: Negative for shortness of breath and difficulty breathing.   Cardiovascular: Negative for chest pain, palpitations, hypertension, irregular heartbeat and swelling in legs/feet.  Gastrointestinal: Negative for constipation and diarrhea.  Endocrine: Negative for increased urination.  Musculoskeletal: Positive for arthralgias and joint pain. Negative for joint swelling, myalgias, muscle weakness, morning stiffness, muscle tenderness and myalgias.  Skin: Negative for color change, rash, hair loss, nodules/bumps, skin tightness, ulcers and sensitivity to sunlight.  Allergic/Immunologic: Negative for susceptible to infections.  Neurological: Negative for dizziness, fainting, memory loss, night sweats and weakness ( ).  Hematological: Negative for swollen glands.  Psychiatric/Behavioral: Negative for depressed  mood and sleep disturbance. The patient is not nervous/anxious.     PMFS History:  Patient Active Problem List   Diagnosis Date Noted  . Family history of psoriatic arthritis 03/02/2017  . Psoriasis 03/01/2017  . Psoriatic arthropathy (HCC) 03/01/2017  . High risk medication use 03/01/2017  . History of gastroesophageal reflux (GERD) 03/01/2017  . 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 Arthritis   . Hypertension   . Psoriasis     Family History  Problem Relation Age of Onset  . Diabetes Mother    History reviewed. No pertinent surgical history. Social History   Social History Narrative  . Not on file    Objective: Vital Signs: BP 129/87 (BP Location: Left Arm, Patient Position: Sitting, Cuff Size: Large)   Pulse 73   Resp 15   Ht 6\' 3"  (1.905 m)   Wt 281 lb 9.6 oz (127.7 kg)   BMI 35.20 kg/m    Physical Exam  Constitutional: He is oriented to person, place, and time. He appears well-developed and well-nourished.  HENT:  Head: Normocephalic and atraumatic.  Eyes: Pupils are equal, round, and reactive to light. Conjunctivae and EOM are normal.  Neck: Normal range of motion. Neck supple.  Cardiovascular: Normal rate, regular rhythm and normal heart sounds.  Pulmonary/Chest: Effort normal and breath sounds normal.  Abdominal: Soft. Bowel sounds are normal.  Neurological: He is alert and oriented to person, place, and time.  Skin: Skin is warm and dry. Capillary refill takes less than 2 seconds.  Psychiatric: He has a normal mood and affect. His behavior is normal.  Nursing note and vitals  reviewed.    Musculoskeletal Exam: C-spine thoracic lumbar spine good range of motion.  He had no tenderness over SI joints.  Shoulder joints elbow joints wrist joint MCPs PIPs DIPs been good range of motion with no synovitis.  Hip joints knee joints ankles MTPs PIPs were in good range of motion with no synovitis.  CDAI Exam: CDAI Score: 0.4    Patient Global Assessment: 2 (mm); Provider Global Assessment: 2 (mm) Swollen: 0 ; Tender: 0  Joint Exam   Not documented   There is currently no information documented on the homunculus. Go to the Rheumatology activity and complete the homunculus joint exam.  Investigation: No additional findings.  Imaging: No results found.  Recent Labs: Lab Results  Component Value Date   WBC 10.4 01/03/2018   HGB 14.3 01/03/2018   PLT 347 01/03/2018   NA 141 01/03/2018   K 4.0 01/03/2018   CL 108 01/03/2018   CO2 27 01/03/2018   GLUCOSE 83 01/03/2018   BUN 17 01/03/2018   CREATININE 0.97 01/03/2018   BILITOT 0.4 01/03/2018   ALKPHOS 74 06/27/2017   AST 18 01/03/2018   ALT 27 01/03/2018   PROT 6.8 01/03/2018   ALBUMIN 4.5 06/27/2017   CALCIUM 9.2 01/03/2018   GFRAA 108 01/03/2018   QFTBGOLDPLUS NEGATIVE 01/03/2018    Speciality Comments: No specialty comments available.  Procedures:  No procedures performed Allergies: Patient has no known allergies.   Assessment / Plan:     Visit Diagnoses: Psoriatic arthropathy (HCC)-his arthritis seems to be very well controlled with Humira and methotrexate combination.  He had no synovitis on examination.  He complains of occasional arthralgias.  Psoriasis-he has no active psoriasis lesions.  High risk medication use - Current regimen includes Humira 40 mg subq every 14 days, methotrexate 6 tabs (15mg ) weekly, and folic acid 2 mg daily.  Last TB gold negative on 01/03/18.  Last CBC/CMP stable on 04/05/18. Recommend flu and Pneumovax 23 as indicated.   I had detailed discussion to get labs every 3 months. I advised him to get a skin examination on yearly basis to screen for skin cancer as he is on Humira.   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  Family history of psoriatic arthritis  History of gastroesophageal reflux (GERD)   Orders: Orders Placed This Encounter  Procedures  . CBC with Differential/Platelet  .  COMPLETE METABOLIC PANEL WITH GFR   No orders of the defined types were placed in this encounter.   Follow-Up Instructions: Return in about 5 months (around 11/27/2018) for Psoriatic arthritis.   Pollyann Savoy, MD  Note - This record has been created using Animal nutritionist.  Chart creation errors have been sought, but may not always  have been located. Such creation errors do not reflect on  the standard of medical care.

## 2018-06-28 ENCOUNTER — Encounter: Payer: Self-pay | Admitting: Physician Assistant

## 2018-06-28 ENCOUNTER — Ambulatory Visit (INDEPENDENT_AMBULATORY_CARE_PROVIDER_SITE_OTHER): Payer: BLUE CROSS/BLUE SHIELD | Admitting: Rheumatology

## 2018-06-28 VITALS — BP 129/87 | HR 73 | Resp 15 | Ht 75.0 in | Wt 281.6 lb

## 2018-06-28 DIAGNOSIS — Z840 Family history of diseases of the skin and subcutaneous tissue: Secondary | ICD-10-CM

## 2018-06-28 DIAGNOSIS — Z79899 Other long term (current) drug therapy: Secondary | ICD-10-CM | POA: Diagnosis not present

## 2018-06-28 DIAGNOSIS — L4050 Arthropathic psoriasis, unspecified: Secondary | ICD-10-CM

## 2018-06-28 DIAGNOSIS — L409 Psoriasis, unspecified: Secondary | ICD-10-CM | POA: Diagnosis not present

## 2018-06-28 DIAGNOSIS — M533 Sacrococcygeal disorders, not elsewhere classified: Secondary | ICD-10-CM | POA: Diagnosis not present

## 2018-06-28 DIAGNOSIS — Z8261 Family history of arthritis: Secondary | ICD-10-CM

## 2018-06-28 DIAGNOSIS — G8929 Other chronic pain: Secondary | ICD-10-CM

## 2018-06-28 DIAGNOSIS — Z8719 Personal history of other diseases of the digestive system: Secondary | ICD-10-CM

## 2018-06-28 NOTE — Patient Instructions (Addendum)
Recommend flu and Pneumovax 23 as indicated.  Standing Labs We placed an order today for your standing lab work.    Please come back and get your standing labs in February and every 3 months  We have open lab Monday through Friday from 8:30-11:30 AM and 1:30-4:00 PM  at the office of Dr. Pollyann Savoy.   You may experience shorter wait times on Monday and Friday afternoons. The office is located at [address], [address], [place], [place] [address] No appointment is necessary.   Labs are drawn by [facility].  You may receive a bill from [facility] [place] for your lab work. If you have any questions regarding directions or hours of operation,  please call [phone].   Just as a reminder please drink plenty of water prior to coming for your lab work. Thanks!  Please get  skin examination by dermatologist to screen for his skin cancer

## 2018-06-29 LAB — COMPLETE METABOLIC PANEL WITH GFR
AG Ratio: 1.8 (calc) (ref 1.0–2.5)
ALT: 36 U/L (ref 9–46)
AST: 22 U/L (ref 10–40)
Albumin: 4.8 g/dL (ref 3.6–5.1)
Alkaline phosphatase (APISO): 77 U/L (ref 40–115)
BUN: 20 mg/dL (ref 7–25)
CALCIUM: 9.7 mg/dL (ref 8.6–10.3)
CO2: 27 mmol/L (ref 20–32)
CREATININE: 1.02 mg/dL (ref 0.60–1.35)
Chloride: 106 mmol/L (ref 98–110)
GFR, EST AFRICAN AMERICAN: 102 mL/min/{1.73_m2} (ref >=60)
GFR, EST NON AFRICAN AMERICAN: 88 mL/min/{1.73_m2} (ref >=60)
Globulin: 2.6 g/dL (calc) (ref 1.9–3.7)
Glucose, Bld: 79 mg/dL (ref 65–99)
Potassium: 4.4 mmol/L (ref 3.5–5.3)
Sodium: 142 mmol/L (ref 135–146)
TOTAL PROTEIN: 7.4 g/dL (ref 6.1–8.1)
Total Bilirubin: 0.8 mg/dL (ref 0.2–1.2)

## 2018-06-29 LAB — CBC WITH DIFFERENTIAL/PLATELET
BASOS PCT: 0.8 %
Basophils Absolute: 86 cells/uL (ref 0–200)
EOS ABS: 161 {cells}/uL (ref 15–500)
Eosinophils Relative: 1.5 %
HCT: 43.5 % (ref 38.5–50.0)
HEMOGLOBIN: 15.1 g/dL (ref 13.2–17.1)
Lymphs Abs: 2461 cells/uL (ref 850–3900)
MCH: 30.9 pg (ref 27.0–33.0)
MCHC: 34.7 g/dL (ref 32.0–36.0)
MCV: 89.1 fL (ref 80.0–100.0)
MPV: 9.8 fL (ref 7.5–12.5)
Monocytes Relative: 8.5 %
NEUTROS ABS: 7083 {cells}/uL (ref 1500–7800)
Neutrophils Relative %: 66.2 %
Platelets: 366 10*3/uL (ref 140–400)
RBC: 4.88 10*6/uL (ref 4.20–5.80)
RDW: 13.1 % (ref 11.0–15.0)
Total Lymphocyte: 23 %
WBC: 10.7 10*3/uL (ref 3.8–10.8)
WBCMIX: 910 {cells}/uL (ref 200–950)

## 2018-06-30 ENCOUNTER — Other Ambulatory Visit: Payer: Self-pay | Admitting: Rheumatology

## 2018-06-30 NOTE — Telephone Encounter (Signed)
Last visit: 06/28/18 Next Visit: 11/28/18 Labs: 06/28/18 WNL  Okay to refill per Dr. Corliss Skains

## 2018-07-06 ENCOUNTER — Telehealth: Payer: Self-pay | Admitting: Rheumatology

## 2018-07-06 MED ORDER — ADALIMUMAB 40 MG/0.8ML ~~LOC~~ AJKT: 40.0000 mg | AUTO-INJECTOR | SUBCUTANEOUS | 0 refills | Status: DC

## 2018-07-06 NOTE — Telephone Encounter (Signed)
Patient's wife calling to request refill on patients Humira sent to [facility].

## 2018-07-06 NOTE — Telephone Encounter (Signed)
Last visit: 06/28/18 Next Visit: 11/28/18 Labs: 06/28/18 WNL Tb Gold: 01/03/18 neg   Okay to refill per Dr. Corliss Skainseveshwar

## 2018-09-14 ENCOUNTER — Other Ambulatory Visit: Payer: Self-pay | Admitting: Rheumatology

## 2018-09-14 NOTE — Telephone Encounter (Signed)
Last visit: 06/28/18 Next Visit: 11/28/18 Labs: 06/28/18 WNL TB Gold: 01/03/18 Neg   Okay to refill per Dr. Corliss Skainseveshwar

## 2018-09-28 ENCOUNTER — Other Ambulatory Visit: Payer: Self-pay | Admitting: Rheumatology

## 2018-09-28 DIAGNOSIS — Z79899 Other long term (current) drug therapy: Secondary | ICD-10-CM

## 2018-09-28 NOTE — Telephone Encounter (Signed)
Last visit: 06/28/18 Next Visit: 11/28/18 Labs: 06/28/18 WNL  Patient's wife advised patient needs to update labs. Will go to PCP to have them performed. Orders faxed.   Okay to refill 30 day supply per Dr. Corliss Skains

## 2018-10-26 ENCOUNTER — Telehealth: Payer: Self-pay | Admitting: Rheumatology

## 2018-10-26 MED ORDER — METHOTREXATE 2.5 MG PO TABS: 15.0000 mg | ORAL_TABLET | ORAL | 0 refills | Status: DC

## 2018-10-26 NOTE — Telephone Encounter (Signed)
Patient's wife [facility] called checking if we received Andrew Chapman's labwork results that he had at Dr. Thomes Lolling office.  [name] stated she will call their office and have them refaxed so he can have his prescription of Methotrexate refilled.

## 2018-10-26 NOTE — Addendum Note (Signed)
Addended by: Henriette Combs on: 10/26/2018 12:49 PM   Modules accepted: Orders

## 2018-10-26 NOTE — Telephone Encounter (Signed)
Received results from Dr. Thomes Lolling office. Labs drawn on 09/29/2018. Labs are WNL. Patient needs refill on MTX  Last visit: 06/28/18 Next Visit: 11/28/18 Labs: 09/29/18 WNL

## 2018-10-26 NOTE — Telephone Encounter (Signed)
Spoke with [facility] and advised we have not received the lab worl at this time. She states she has spoke with Dr. Thomes Lolling office and they will be re-faxing the lab results.

## 2018-11-21 NOTE — Progress Notes (Deleted)
Virtual Visit via Video Note  I connected with Andrew Chapman on 11/21/18 at  9:45 AM EDT by a video enabled telemedicine application and verified that I am speaking with the correct person using two identifiers.   I discussed the limitations of evaluation and management by telemedicine and the availability of in person appointments. The patient expressed understanding and agreed to proceed.  CC:  History of Present Illness: Patient is a 48 year old male with a past medical history of psoriatic arthritis and chronic SI joint pain.  He is on Humira 40 mg sq injections every 14 days, MTX 6 tablets po once weekly, and folic acid 2 mg po daily.     Patient reports morning stiffness for  {minute/hour:19697}.   Patient {Actions; denies-reports:120008} nocturnal pain.  Difficulty dressing/grooming: {ACTIONS;DENIES/REPORTS:21021675::"Denies"} Difficulty climbing stairs: {ACTIONS;DENIES/REPORTS:21021675::"Denies"} Difficulty getting out of chair: {ACTIONS;DENIES/REPORTS:21021675::"Denies"} Difficulty using hands for taps, buttons, cutlery, and/or writing: {ACTIONS;DENIES/REPORTS:21021675::"Denies"}    ROS  Observations/Objective: Physical Exam  Constitutional: He is oriented to person, place, and time.  Neurological: He is alert and oriented to person, place, and time.  Psychiatric: Mood, memory, affect and judgment normal.     Assessment and Plan: Psoriatic arthropathy (HCC)-  Psoriasis-he has no active psoriasis lesions.  High risk medication use - Current regimen includes Humira 40 mg subq every 14 days, methotrexate 6 tabs (15mg ) weekly, and folic acid 2 mg daily.  Last TB gold negative on 01/03/18.   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  Family history of psoriatic arthritis  History of gastroesophageal reflux (GERD)   Follow Up Instructions:    I discussed the assessment and treatment plan with the patient. The patient was provided an opportunity to  ask questions and all were answered. The patient agreed with the plan and demonstrated an understanding of the instructions.   The patient was advised to call back or seek an in-person evaluation if the symptoms worsen or if the condition fails to improve as anticipated.  I provided *** minutes of non-face-to-face time during this encounter.   Gearldine Bienenstock, PA-C

## 2018-11-28 ENCOUNTER — Encounter: Payer: Self-pay | Admitting: Rheumatology

## 2018-11-28 ENCOUNTER — Telehealth (INDEPENDENT_AMBULATORY_CARE_PROVIDER_SITE_OTHER): Payer: BLUE CROSS/BLUE SHIELD | Admitting: Rheumatology

## 2018-11-28 ENCOUNTER — Other Ambulatory Visit: Payer: Self-pay

## 2018-11-28 DIAGNOSIS — E6609 Other obesity due to excess calories: Secondary | ICD-10-CM

## 2018-11-28 DIAGNOSIS — M533 Sacrococcygeal disorders, not elsewhere classified: Secondary | ICD-10-CM

## 2018-11-28 DIAGNOSIS — G8929 Other chronic pain: Secondary | ICD-10-CM

## 2018-11-28 DIAGNOSIS — L4050 Arthropathic psoriasis, unspecified: Secondary | ICD-10-CM

## 2018-11-28 DIAGNOSIS — Z840 Family history of diseases of the skin and subcutaneous tissue: Secondary | ICD-10-CM

## 2018-11-28 DIAGNOSIS — Z8719 Personal history of other diseases of the digestive system: Secondary | ICD-10-CM

## 2018-11-28 DIAGNOSIS — Z6837 Body mass index (BMI) 37.0-37.9, adult: Secondary | ICD-10-CM

## 2018-11-28 DIAGNOSIS — L409 Psoriasis, unspecified: Secondary | ICD-10-CM

## 2018-11-28 DIAGNOSIS — Z8261 Family history of arthritis: Secondary | ICD-10-CM

## 2018-11-28 DIAGNOSIS — Z79899 Other long term (current) drug therapy: Secondary | ICD-10-CM

## 2018-11-28 NOTE — Progress Notes (Signed)
Virtual Visit via Telephone Note  I connected with Andrew Chapman on 11/28/18 at  9:45 AM EDT by telephone and verified that I am speaking with the correct person using two identifiers.   I discussed the limitations, risks, security and privacy concerns of performing an evaluation and management service by telephone and the availability of in person appointments. I also discussed with the patient that there may be a patient responsible charge related to this service. The patient expressed understanding and agreed to proceed.  CC: Medication monitoring   History of Present Illness: Patient is a 48 year old male with a past medical history of psoriatic arthritis and chronic SI joint pain.  He is on Humira 40 mg sq injections every 14 days, MTX 6 tablets po once weekly, and folic acid 2 mg po daily. He denies any joint pain or joint swelling.  He denies any recent flares. He has no active psoriasis at this time.  He has no SI joint pain. He denies achilles tendonitis or plantar fasciitis.  He has been laid off since 11/17/18 so he has not been as active.    Review of Systems  Constitutional: Negative for fever and malaise/fatigue.  Eyes: Negative for photophobia, pain, discharge and redness.  Respiratory: Negative for cough, shortness of breath and wheezing.   Cardiovascular: Negative for chest pain and palpitations.  Gastrointestinal: Negative for blood in stool, constipation and diarrhea.  Genitourinary: Negative for dysuria.  Musculoskeletal: Negative for back pain, joint pain, myalgias and neck pain.       +morning stiffness  Skin: Negative for rash.       Denies psoriasis  Neurological: Negative for dizziness and headaches.  Psychiatric/Behavioral: Negative for depression. The patient is not nervous/anxious and does not have insomnia.      Observations/Objective: Physical Exam  Constitutional: He is oriented to person, place, and time.  Neurological: He is alert and oriented to  person, place, and time.  Psychiatric: Mood, memory, affect and judgment normal.  No psoriasis lesions.   Patient reports morning stiffness for 5 minutes.   Patient denies nocturnal pain.  Difficulty dressing/grooming: Denies Difficulty climbing stairs: Denies Difficulty getting out of chair: Denies Difficulty using hands for taps, buttons, cutlery, and/or writing: Denies    Assessment and Plan: Psoriatic arthropathy (HCC)-He has not had any recent flares.  He has no joint pain or joint swelling at this time. He has morning stiffness lasting about 5 minutes.  He has no SI joint pain, achilles tendonitis, or achilles tendonitis.  He has no active psoriasis at this time. He is clinically doing well on Humira 40 mg sq every 14 days, MTX 6 tablets po once weekly, and folic acid 2 mg po daily.  He will continue on this current treatment regimen.  He does not need any refills at this time.  He was advised to notify us if he develops increased joint pain or joint swelling.  He was advised to hold his doses of Humira and MTX if he develops an infection.  He will follow up in 4 months.    Psoriasis-He has no active psoriasis lesions.  High risk medication use -Current regimen includes Humira 40 mg subq every 14 days, methotrexate 6 tabs (15mg ) weekly, and folic acid 2 mg daily. Last TB gold negative on 01/03/18. Future orders for TB gold, CBC, and CMP were placed today.  He will be due for lab work in May and every 3 months. He will obtain lab work at his  PCPs office.   Chronic SI joint pain-He has no SI joint pain.   Family history of psoriatic arthritis  Follow Up Instructions: He will follow up 4 months.   He is due for lab work in May: CBC, CMP, and TB gold. Future orders placed today.   I discussed the assessment and treatment plan with the patient. The patient was provided an opportunity to ask questions and all were answered. The patient agreed with the plan and demonstrated an  understanding of the instructions.   The patient was advised to call back or seek an in-person evaluation if the symptoms worsen or if the condition fails to improve as anticipated.  I provided 15 minutes of non-face-to-face time during this encounter. Pollyann SavoyShaili Deveshwar, MD  Scribed by- Gearldine Bienenstockaylor M Eliah Marquard, PA-C

## 2018-11-29 ENCOUNTER — Telehealth: Payer: Self-pay | Admitting: Rheumatology

## 2018-11-29 NOTE — Telephone Encounter (Signed)
I LMOM for patient to call, and schedule a fu appt in 4 months with Sherron Ales, PAC.

## 2018-11-29 NOTE — Telephone Encounter (Signed)
-----   Message from Ellen Henri, CMA sent at 11/28/2018  2:19 PM EDT ----- Patient had a virtual office visit today with Dr. Corliss Skains. Please call to schedule 4 month follow up. Thanks!

## 2018-12-06 ENCOUNTER — Encounter: Payer: Self-pay | Admitting: Rheumatology

## 2018-12-06 ENCOUNTER — Other Ambulatory Visit: Payer: Self-pay | Admitting: Rheumatology

## 2018-12-06 NOTE — Telephone Encounter (Signed)
Last Visit: 11/28/2018 telemedicine  Next Visit: front desk has message to schedule  Labs: 09/29/2018 WNL  TB Gold: 01/03/2018 negative   Okay to refill per Dr. Corliss Skains.

## 2018-12-28 ENCOUNTER — Encounter: Payer: Self-pay | Admitting: Rheumatology

## 2019-01-12 ENCOUNTER — Other Ambulatory Visit: Payer: Self-pay | Admitting: Rheumatology

## 2019-01-12 NOTE — Telephone Encounter (Signed)
Last Visit: 11/28/2018 telemedicine  Next Visit: due in August. Message sent to front desk to schedule   Labs: 09/29/2018 WNL   Okay to refill per Dr. Corliss Skains

## 2019-01-12 NOTE — Telephone Encounter (Signed)
Please schedule patient a follow up visit. Patient due August 2020. Thanks!  

## 2019-02-13 ENCOUNTER — Other Ambulatory Visit: Payer: Self-pay | Admitting: Rheumatology

## 2019-02-15 ENCOUNTER — Telehealth: Payer: Self-pay | Admitting: Rheumatology

## 2019-02-15 NOTE — Telephone Encounter (Signed)
[name] left a voicemail stating [facility] needs a new prescription for Humira to be called in by Dr. Estanislado Pandy before they will refill Andrew Chapman's prescription.  [name] requested a return call.

## 2019-02-15 NOTE — Telephone Encounter (Signed)
[name] advised that the prescription can not be refilled at this time as it is too early. Advised that we can refill in the prescription in July.

## 2019-02-17 ENCOUNTER — Other Ambulatory Visit: Payer: Self-pay | Admitting: Rheumatology

## 2019-03-05 ENCOUNTER — Telehealth: Payer: Self-pay | Admitting: Rheumatology

## 2019-03-05 MED ORDER — HUMIRA PEN 40 MG/0.8ML ~~LOC~~ PNKT: 40.0000 mg | PEN_INJECTOR | SUBCUTANEOUS | 0 refills | Status: DC

## 2019-03-05 NOTE — Telephone Encounter (Signed)
Last Visit:11/28/2018 telemedicine Next Visit: 04/11/19  Labs: 12/27/18 Neutrophils 7.3 Monocytes (Abs) 1.1 Chloride 101  TB Gold: 12/27/18 Neg   Okay to refill per Dr. Estanislado Pandy

## 2019-03-05 NOTE — Telephone Encounter (Signed)
Andrew Chapman called requesting prescription refill of Humira to be sent to [facility].

## 2019-03-22 ENCOUNTER — Telehealth: Payer: Self-pay | Admitting: Rheumatology

## 2019-03-22 DIAGNOSIS — Z79899 Other long term (current) drug therapy: Secondary | ICD-10-CM

## 2019-03-22 NOTE — Telephone Encounter (Addendum)
Patient's wife advise patient may schedule a virtual visit instead of an in office visit which he has scheduled for 04/11/19. Advised her lab orders faxed to Dr. Jerrol Banana office. Patient is also in need of refill of Folic Acid.    Last Visit: 11/28/18 Next Visit: 04/11/19  Okay to refill per Dr. Estanislado Pandy

## 2019-03-22 NOTE — Telephone Encounter (Signed)
#  1. Patient wanted lab orders to be sent to Dr. Chauncey Reading in [place]. Patient would like to get labs Monday.  #2. Patient has follow up appt scheduled for 8/19 in the office. Patient would like to know if he can do another phone appt. Instead? Please call wife to advise.

## 2019-03-23 MED ORDER — FOLIC ACID 1 MG PO TABS: 2.0000 mg | ORAL_TABLET | Freq: Every day | ORAL | 3 refills | Status: DC

## 2019-03-23 NOTE — Telephone Encounter (Signed)
LMOM for patient to call and reschedule to a virtual appointment.

## 2019-03-23 NOTE — Telephone Encounter (Signed)
Please change patient's in office appointment to a virtual appointment. Thanks!

## 2019-03-28 NOTE — Progress Notes (Deleted)
Virtual Visit via Telephone Note  I connected with Andrew Chapman on 03/28/19 at  1:00 PM EDT by telephone and verified that I am speaking with the correct person using two identifiers.  Location: Patient: *** Provider: *** This service was conducted via virtual visit.  Both audio and visual tools were used.  The patient was located at home. I was located in my office.  Consent was obtained prior to the virtual visit and is aware of possible charges through their insurance for this visit.  The patient is an established patient.  Dr. Estanislado Pandy, MD conducted the virtual visit. Office staff helped with scheduling follow up visits after the service was conducted.   I discussed the limitations, risks, security and privacy concerns of performing an evaluation and management service by telephone and the availability of in person appointments. I also discussed with the patient that there may be a patient responsible charge related to this service. The patient expressed understanding and agreed to proceed.  CC:  History of Present Illness: Patient is a 48 year old male with a past medical history of psoriatic arthritis.  He is on Humira 40 mg sq every 14 days, MTX 6 tablets po once weekly, and folic acid 2 mg po daily.  Review of Systems  Constitutional: Negative for fever and malaise/fatigue.  Eyes: Negative for photophobia, pain, discharge and redness.  Respiratory: Negative for cough, shortness of breath and wheezing.   Cardiovascular: Negative for chest pain and palpitations.  Gastrointestinal: Negative for blood in stool, constipation and diarrhea.  Genitourinary: Negative for dysuria.  Musculoskeletal: Negative for back pain, joint pain, myalgias and neck pain.  Skin: Negative for rash.  Neurological: Negative for dizziness and headaches.  Psychiatric/Behavioral: Negative for depression. The patient is not nervous/anxious and does not have insomnia.       Observations/Objective: Physical  Exam  Constitutional: He is oriented to person, place, and time.  Neurological: He is alert and oriented to person, place, and time.  Psychiatric: Mood, memory, affect and judgment normal.   Patient reports morning stiffness for  {minute/hour:19697}.   Patient {Actions; denies-reports:120008} nocturnal pain.  Difficulty dressing/grooming: {ACTIONS;DENIES/REPORTS:21021675::"Denies"} Difficulty climbing stairs: {ACTIONS;DENIES/REPORTS:21021675::"Denies"} Difficulty getting out of chair: {ACTIONS;DENIES/REPORTS:21021675::"Denies"} Difficulty using hands for taps, buttons, cutlery, and/or writing: {ACTIONS;DENIES/REPORTS:21021675::"Denies"}   Assessment and Plan: Psoriatic arthropathy ([place])-  Psoriasis-He has no active psoriasis lesions.  High risk medication use -Current regimen includes Humira 40 mg subq every 14 days, methotrexate 6 tabs (15mg ) weekly, and folic acid 2 mg daily.   Chronic SI joint pain-  Family history of psoriatic arthritis  Follow Up Instructions: He will follow up in    I discussed the assessment and treatment plan with the patient. The patient was provided an opportunity to ask questions and all were answered. The patient agreed with the plan and demonstrated an understanding of the instructions.   The patient was advised to call back or seek an in-person evaluation if the symptoms worsen or if the condition fails to improve as anticipated.  I provided *** minutes of non-face-to-face time during this encounter.   [name], PA-C

## 2019-04-06 ENCOUNTER — Ambulatory Visit: Payer: BLUE CROSS/BLUE SHIELD | Admitting: Rheumatology

## 2019-04-11 ENCOUNTER — Ambulatory Visit: Payer: BLUE CROSS/BLUE SHIELD | Admitting: Physician Assistant

## 2019-04-13 NOTE — Progress Notes (Signed)
Virtual Visit via Telephone Note  I connected with Andrew Chapman on 04/13/19 at 12:30 PM EDT by telephone and verified that I am speaking with the correct person using two identifiers.  Location: Patient: Home  Provider: Clinic  This service was conducted via virtual visit. The patient was located at home. I was located in my office.  Consent was obtained prior to the virtual visit and is aware of possible charges through their insurance for this visit.  The patient is an established patient.  Dr. Corliss Skainseveshwar, MD conducted the virtual visit and Sherron Alesaylor Celesta Funderburk, PA-C acted as scribe during the service.  Office staff helped with scheduling follow up visits after the service was conducted.   I discussed the limitations, risks, security and privacy concerns of performing an evaluation and management service by telephone and the availability of in person appointments. I also discussed with the patient that there may be a patient responsible charge related to this service. The patient expressed understanding and agreed to proceed.  CC: Morning stiffness  History of Present Illness: Patient is a 48 year old male with a past medical history of psoriatic arthritis.  He is on Humira 40 mg sq injections every 14 days, Methotrexate 6 tablets by mouth once weekly, and folic acid 2 mg po daily. He denies any recent flares.  He denies any joint pain or joint swelling currently. He experiences morning stiffness daily.  He states the stiffness is worse when it rains.  He has 2 small patches of psoriasis on both elbow joints, which he noticed start 2 months ago.  He states the patches are smaller than the size of a dime.  He has been using topical cream.  He does not need a refill at this time.   Review of Systems  Constitutional: Positive for malaise/fatigue. Negative for fever.  Eyes: Negative for photophobia, pain, discharge and redness.  Respiratory: Negative for cough, shortness of breath and wheezing.    Cardiovascular: Negative for chest pain and palpitations.  Gastrointestinal: Negative for blood in stool, constipation and diarrhea.  Genitourinary: Negative for dysuria.  Musculoskeletal: Negative for back pain, joint pain, myalgias and neck pain.       +Joint stiffness   Skin: Positive for rash (Psoriasis ).  Neurological: Negative for dizziness and headaches.  Psychiatric/Behavioral: Negative for depression. The patient is not nervous/anxious and does not have insomnia.       Observations/Objective: Physical Exam  Constitutional: He is oriented to person, place, and time.  Neurological: He is alert and oriented to person, place, and time.  Psychiatric: Mood, memory, affect and judgment normal.   Patient reports morning stiffness for 5-10 minutes.   Patient denies nocturnal pain.  Difficulty dressing/grooming: Denies Difficulty climbing stairs: Denies Difficulty getting out of chair: Denies Difficulty using hands for taps, buttons, cutlery, and/or writing: Denies   Assessment and Plan: Psoriatic arthropathy (HCC)- He has not had any recent psoriatic arthritis flares.  He has no joint pain or joint swelling at this time.  He experiences morning stiffness lasting 5-10 minutes.  He experiences increased joint stiffness with weather changes.  He has no SI joint pain at this time.  He has no achilles tendonitis or plantar fasciitis. He is on Humira 40 mg sq injections every 14 days, Methotrexate 6 tablets by mouth once weekly, and folic acid 2 mg po daily. He will continue on this current treatment regimen.  He does not need any refills at this time. He was advised to notify Andrew Chapman  if he develops increased joint pain or joint swelling.  He will follow up in 3 months for an in-office visit.   Psoriasis-He has 1 small patch of psoriasis on the extensor surface of both elbow joints.  He noticed these patches start about 2 months ago.  He has been applying topical agents, which help.  He does not  need any refills at this time.  He was advised to notify us if the patches worsen.     High risk medication use -Current regimen includes Humira 40 mg subq every 14 days, methotrexate 6 tablets by mouth once weekly, and folic acid 2 mg daily. TB gold negative on 12/27/18.  CBC and CMP were drawn on 12/27/18. He is overdue to update lab work.  Standing orders are in place. He is following up with his PCP for lab work next week, and he will have lab work faxed to our office.   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   Family history of psoriatic arthritis     Follow Up Instructions: He will follow up in 3 months.    I discussed the assessment and treatment plan with the patient. The patient was provided an opportunity to ask questions and all were answered. The patient agreed with the plan and demonstrated an understanding of the instructions.   The patient was advised to call back or seek an in-person evaluation if the symptoms worsen or if the condition fails to improve as anticipated.  I provided 15 minutes of non-face-to-face time during this encounter.  [name], MD   Scribed by- [name], PA-C

## 2019-04-20 ENCOUNTER — Other Ambulatory Visit: Payer: Self-pay | Admitting: Rheumatology

## 2019-04-20 NOTE — Telephone Encounter (Signed)
ok 

## 2019-04-20 NOTE — Telephone Encounter (Signed)
Last Visit:11/28/2018 telemedicine Next Visit: 04/27/19  Labs: 12/27/18 Neutrophils (abs) 7.3, Chloride 107, Monocyte (abs) 1.1  Patient to update labs at appointment on 04/27/19  Okay to refill 30 day supply?

## 2019-04-27 ENCOUNTER — Encounter: Payer: Self-pay | Admitting: Rheumatology

## 2019-04-27 ENCOUNTER — Telehealth (INDEPENDENT_AMBULATORY_CARE_PROVIDER_SITE_OTHER): Payer: BC Managed Care – PPO | Admitting: Rheumatology

## 2019-04-27 DIAGNOSIS — L409 Psoriasis, unspecified: Secondary | ICD-10-CM

## 2019-04-27 DIAGNOSIS — Z8261 Family history of arthritis: Secondary | ICD-10-CM

## 2019-04-27 DIAGNOSIS — Z840 Family history of diseases of the skin and subcutaneous tissue: Secondary | ICD-10-CM

## 2019-04-27 DIAGNOSIS — Z79899 Other long term (current) drug therapy: Secondary | ICD-10-CM

## 2019-04-27 DIAGNOSIS — L4050 Arthropathic psoriasis, unspecified: Secondary | ICD-10-CM | POA: Diagnosis not present

## 2019-04-27 DIAGNOSIS — G8929 Other chronic pain: Secondary | ICD-10-CM

## 2019-04-27 DIAGNOSIS — M533 Sacrococcygeal disorders, not elsewhere classified: Secondary | ICD-10-CM | POA: Diagnosis not present

## 2019-04-27 DIAGNOSIS — Z8719 Personal history of other diseases of the digestive system: Secondary | ICD-10-CM

## 2019-05-15 ENCOUNTER — Telehealth: Payer: Self-pay | Admitting: Pharmacist

## 2019-05-15 ENCOUNTER — Telehealth: Payer: Self-pay | Admitting: Pharmacy Technician

## 2019-05-15 NOTE — Telephone Encounter (Signed)
Received PA request from [facility] stating current Humira PA will expire 05/18/2019.  Please submit PA to covermymeds.  Key: [id number]

## 2019-05-15 NOTE — Telephone Encounter (Signed)
PA has been Approved through 05/14/2020.

## 2019-05-15 NOTE — Telephone Encounter (Signed)
Received notification from [employer] regarding a prior authorization for [place]. Authorization has been APPROVED from 04/15/19 to 05/14/20.   Will send document to scan center.  Authorization # [id number]  10:36 AM Beatriz Chancellor, CPhT

## 2019-05-23 ENCOUNTER — Encounter: Payer: Self-pay | Admitting: Rheumatology

## 2019-07-17 ENCOUNTER — Other Ambulatory Visit: Payer: Self-pay | Admitting: Rheumatology

## 2019-07-18 NOTE — Telephone Encounter (Signed)
Last Visit: 04/27/2019 telemedicine  Next Visit: 08/14/2019 Labs: 05/02/2019 glucose 102, BUN 23, ALT 50 previously 41, Basos 1.1  Okay to refill MTX?

## 2019-07-18 NOTE — Telephone Encounter (Signed)
Decrease MTX to 5 tablets once weekly, per Dr. Estanislado Pandy. Patient's wife is aware, she verbalized understanding.

## 2019-07-31 ENCOUNTER — Telehealth: Payer: Self-pay | Admitting: Rheumatology

## 2019-07-31 NOTE — Telephone Encounter (Signed)
Patient's wife requesting lab orders to be sent to Dr. Emelda Fear in [place] for patient.

## 2019-07-31 NOTE — Telephone Encounter (Signed)
Lab orders have been faxed to Dr. Chauncey Reading.

## 2019-08-14 ENCOUNTER — Ambulatory Visit: Payer: BC Managed Care – PPO | Admitting: Rheumatology

## 2019-08-14 ENCOUNTER — Other Ambulatory Visit: Payer: Self-pay | Admitting: Rheumatology

## 2019-08-18 ENCOUNTER — Other Ambulatory Visit: Payer: Self-pay | Admitting: Rheumatology

## 2019-09-07 ENCOUNTER — Encounter: Payer: Self-pay | Admitting: Rheumatology

## 2019-09-12 ENCOUNTER — Telehealth: Payer: Self-pay | Admitting: Rheumatology

## 2019-09-12 MED ORDER — HUMIRA PEN 40 MG/0.8ML ~~LOC~~ PNKT: 40.0000 mg | PEN_INJECTOR | SUBCUTANEOUS | 0 refills | Status: DC

## 2019-09-12 NOTE — Telephone Encounter (Signed)
Advised patient's wife we received labs and will send prescription to the pharmacy.   Last Visit: 04/27/19 Next Visit: 05/09/20 Labs: 09/07/19 BUN 22 all other labs WNL TB Gold:  12/27/18 Neg   Okay to refill per Dr. Corliss Skains

## 2019-09-12 NOTE — Telephone Encounter (Signed)
Patient's wife Andrew Chapman called checking if we received Andrew Chapman's labwork.  Andrew Chapman states he had labwork last week and spoke with Andrew Chapman at Dr. Thomes Lolling office yesterday who said she faxed the results yesterday 09/11/19.  Andrew Chapman is requesting prescription refill of Humira to be sent to [facility].

## 2019-09-13 ENCOUNTER — Telehealth: Payer: Self-pay | Admitting: *Deleted

## 2019-09-13 NOTE — Telephone Encounter (Signed)
Labs received from [facility] [place], [place] labs reviewed by Dr. Pollyann Savoy Labs drawn 09/08/2019, all labs within normal limites except the following:  BUN 22

## 2019-10-04 NOTE — Progress Notes (Signed)
Office Visit Note  Patient: Andrew Chapman             Date of Birth: 1971-04-18           MRN: [id number]             PCP: Lorelei Pont, DO Referring: Lorelei Pont, DO Visit Date: 10/10/2019 Occupation: @GUAROCC @  Subjective:  Psoriasis and cramps in his feet.   History of Present Illness: Andrew Chapman is a 49 y.o. male with a past medical history of psoriatic arthritis. He states that he is currently doing well on Humira 40 mg subq injections every 14 days, Methotrexate 6 tablets by mouth once weekly, and folic acid 2 mg PO daily. He denies any psoriatic arthritis flares at this time. He states that he will experience occasional joint pain with cold weather changes and will use Tylenol to help alleviate the pain. He denies any joint swelling or inflammation. Over the last two to three months, he has noticed psoriasis has reappeared on his bilateral elbows and right knee. He has seen his PCP during this time, who gave him a cream to use. He states that the cream will help, but if he stops using the cream then his rash will reappear. He also endorses right achilles tendinitis pain that waxes and wanes in intensity. He currently uses a compression sock that helps alleviate the pain. He denies plantar fascitis or SI joint pain.  He states he has some cramps in his feet off and on.  Activities of Daily Living:  Patient reports morning stiffness for 30 minutes.   Patient Denies nocturnal pain.  Difficulty dressing/grooming: Denies Difficulty climbing stairs: Denies Difficulty getting out of chair: Denies Difficulty using hands for taps, buttons, cutlery, and/or writing: Denies  Review of Systems  Constitutional: Negative for fatigue.  HENT: Negative for mouth sores, mouth dryness and nose dryness.   Eyes: Negative for itching and dryness.  Respiratory: Negative for shortness of breath and difficulty breathing.   Cardiovascular: Negative for chest pain and palpitations.    Gastrointestinal: Negative for blood in stool, constipation and diarrhea.  Endocrine: Negative for increased urination.  Genitourinary: Negative for difficulty urinating and painful urination.  Musculoskeletal: Positive for arthralgias, joint pain and morning stiffness. Negative for joint swelling.  Skin: Positive for rash.  Allergic/Immunologic: Negative for susceptible to infections.  Neurological: Negative for dizziness, headaches, memory loss and weakness.  Hematological: Negative for bruising/bleeding tendency.  Psychiatric/Behavioral: Negative for confusion and sleep disturbance.    PMFS History:  Patient Active Problem List   Diagnosis Date Noted  . Family history of psoriatic arthritis 03/02/2017  . Psoriasis 03/01/2017  . Psoriatic arthropathy (HCC) 03/01/2017  . High risk medication use 03/01/2017  . History of gastroesophageal reflux (GERD) 03/01/2017  . 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 Arthritis   . Hypertension   . Psoriasis     Family History  Problem Relation Age of Onset  . Diabetes Mother    History reviewed. No pertinent surgical history. Social History   Social History Narrative  . Not on file    There is no immunization history on file for this patient.   Objective: Vital Signs: BP (!) 151/91 (BP Location: Right Arm, Patient Position: Sitting, Cuff Size: Large)   Pulse 72   Resp 16   Ht 6\' 3"  (1.905 m)   Wt (!) 300 lb 9.6 oz (136.4 kg)   BMI 37.57 kg/m  Physical Exam Vitals and nursing note reviewed.  Constitutional:      Appearance: He is well-developed.  HENT:     Head: Normocephalic and atraumatic.  Eyes:     Conjunctiva/sclera: Conjunctivae normal.     Pupils: Pupils are equal, round, and reactive to light.  Cardiovascular:     Rate and Rhythm: Normal rate and regular rhythm.     Heart sounds: Normal heart sounds.  Pulmonary:     Effort: Pulmonary effort is normal.     Breath sounds:  Normal breath sounds.  Abdominal:     General: Bowel sounds are normal.     Palpations: Abdomen is soft.  Musculoskeletal:     Cervical back: Normal range of motion and neck supple.  Skin:    General: Skin is warm and dry.     Capillary Refill: Capillary refill takes less than 2 seconds.     Comments: Dry scales were noted over bilateral elbows and bilateral knees.  Neurological:     Mental Status: He is alert and oriented to person, place, and time.  Psychiatric:        Behavior: Behavior normal.      Musculoskeletal Exam: C-spine was in good range of motion.  Thoracic and lumbar spine had good mobility.  No SI joint tenderness were noted.  Shoulder joints, elbow joints and wrist joints with good range of motion.  He has some DIP and PIP thickening with no synovitis.  Hip joints, knee joints, ankles with good range of motion.  No synovitis was noted.  CDAI Exam: CDAI Score: -- Patient Global: --; Provider Global: -- Swollen: --; Tender: -- Joint Exam 10/10/2019   No joint exam has been documented for this visit   There is currently no information documented on the homunculus. Go to the Rheumatology activity and complete the homunculus joint exam.  Investigation: No additional findings.  Imaging: No results found.  Recent Labs: Lab Results  Component Value Date   WBC 10.7 06/28/2018   HGB 15.1 06/28/2018   PLT 366 06/28/2018   NA 142 06/28/2018   K 4.4 06/28/2018   CL 106 06/28/2018   CO2 27 06/28/2018   GLUCOSE 79 06/28/2018   BUN 20 06/28/2018   CREATININE 1.02 06/28/2018   BILITOT 0.8 06/28/2018   ALKPHOS 74 06/27/2017   AST 22 06/28/2018   ALT 36 06/28/2018   PROT 7.4 06/28/2018   ALBUMIN 4.5 06/27/2017   CALCIUM 9.7 06/28/2018   GFRAA 102 06/28/2018   QFTBGOLDPLUS NEGATIVE 01/03/2018    Speciality Comments: No specialty comments available.  Procedures:  No procedures performed Allergies: Patient has no known allergies.   Assessment / Plan:      Visit Diagnoses: Psoriatic arthropathy (HCC) - Patient has not had any recent psoriatic arthritis flares. He does not have any joint pain or swelling at this time. He is experiencing morning stiffness lasting 30 minutes and joint pain with cold weather changes. He also experiences occasional achilles tendinitis for which he is using a compression sock at this time. He denies plantar fasciitis or SI joint pain. He is currently doing well on Humira 40 mg subq injections every 14 days, Methotrexate 6 tablets by mouth once weekly, and folic acid 2 mg PO daily. He will continue this current regimen. His Methotrexate will be refilled at this time. He was advised to notify us if he develops increasing joint pain or joint swelling. He will follow up in 5 months.  Psoriasis - He currently has  small patches of psoriasis on the bilateral extensor surfaces of his elbow joints and the extensor surface of his right knee. These appeared 2-3 months ago. He was able to see his PCP who gave him a topical cream that has helped, but has not resolved the rash.  He ran out of the topical steroids.  I will give him prescription for clobetasol cream.  Side effects were reviewed.  High risk medication use - Current regimen includes Humira 40 mg subq every 14 days, methotrexate 5 tablets by mouth once weekly, and folic acid 2 mg daily. TB gold negative on 12/27/18. CBC and CMP were completed on 09/07/2019.  His labs are stable.  We will continue to monitor labs every 3 months.  He will get the TB call with his PCP in April.  Muscle cramps-he has been experiencing cramps in his feet.  Have advised him to use magnesium malate 250 mg at bedtime as tolerated.  The dose can be increased if he does not develop any diarrhea.  Chronic SI joint pain-doing better.  Family history of psoriatic arthritis  Orders: No orders of the defined types were placed in this encounter.  Meds ordered this encounter  Medications  . clobetasol cream  (TEMOVATE) 0.05 %    Sig: Apply 1 application topically 2 (two) times daily.    Dispense:  30 g    Refill:  0  . methotrexate (RHEUMATREX) 2.5 MG tablet    Sig: TAKE 5 TABLETS BY MOUTH ONCE A WEEK. CAUTION CHEMOTHERAPY. PROTECT FROM LIGHT.    Dispense:  60 tablet    Refill:  0    Follow-Up Instructions: Return in about 5 months (around 03/08/2020) for Psoriatic arthritis.   [name], MD  Note - This record has been created using Editor, commissioning.  Chart creation errors have been sought, but may not always  have been located. Such creation errors do not reflect on  the standard of medical care

## 2019-10-10 ENCOUNTER — Ambulatory Visit (INDEPENDENT_AMBULATORY_CARE_PROVIDER_SITE_OTHER): Payer: BC Managed Care – PPO | Admitting: Rheumatology

## 2019-10-10 ENCOUNTER — Other Ambulatory Visit: Payer: Self-pay

## 2019-10-10 ENCOUNTER — Encounter: Payer: Self-pay | Admitting: Rheumatology

## 2019-10-10 VITALS — BP 151/91 | HR 72 | Resp 16 | Ht 75.0 in | Wt 300.6 lb

## 2019-10-10 DIAGNOSIS — L4050 Arthropathic psoriasis, unspecified: Secondary | ICD-10-CM

## 2019-10-10 DIAGNOSIS — M533 Sacrococcygeal disorders, not elsewhere classified: Secondary | ICD-10-CM | POA: Diagnosis not present

## 2019-10-10 DIAGNOSIS — G8929 Other chronic pain: Secondary | ICD-10-CM

## 2019-10-10 DIAGNOSIS — Z840 Family history of diseases of the skin and subcutaneous tissue: Secondary | ICD-10-CM

## 2019-10-10 DIAGNOSIS — Z79899 Other long term (current) drug therapy: Secondary | ICD-10-CM | POA: Diagnosis not present

## 2019-10-10 DIAGNOSIS — Z8261 Family history of arthritis: Secondary | ICD-10-CM

## 2019-10-10 DIAGNOSIS — L409 Psoriasis, unspecified: Secondary | ICD-10-CM

## 2019-10-10 MED ORDER — METHOTREXATE 2.5 MG PO TABS: ORAL_TABLET | ORAL | 0 refills | Status: DC

## 2019-10-10 MED ORDER — CLOBETASOL PROPIONATE 0.05 % EX CREA: 1.0000 "application " | TOPICAL_CREAM | Freq: Two times a day (BID) | CUTANEOUS | 0 refills | Status: AC

## 2019-10-10 NOTE — Patient Instructions (Signed)
Standing Labs We placed an order today for your standing lab work.    Please come back and get your standing labs in April and every 3 months   We have open lab daily Monday through Thursday from 8:30-12:30 PM and 1:30-4:30 PM and Friday from 8:30-12:30 PM and 1:30-4:00 PM at the office of Dr. Danuel Felicetti.   You may experience shorter wait times on Monday and Friday afternoons. The office is located at [address], [address], [place], [place] [address] No appointment is necessary.   Labs are drawn by [facility].  You may receive a bill from [facility] for your lab work.  If you wish to have your labs drawn at another location, please call the office 24 hours in advance to send orders.  If you have any questions regarding directions or hours of operation,  please call [phone].   Just as a reminder please drink plenty of water prior to coming for your lab work. Thanks!   

## 2019-12-12 ENCOUNTER — Encounter: Payer: Self-pay | Admitting: Rheumatology

## 2019-12-18 ENCOUNTER — Telehealth: Payer: Self-pay

## 2019-12-18 NOTE — Telephone Encounter (Signed)
Lab results received via fax from PCP.   12/14/2019 CBC, CMP, Tb Gold  Tb gold negative, CBC WNL BUN 23, BUN/creat ratio 29.1.   Labs reviewed by Sherron Ales, PA-C. Will send document to scan center.   Spoke with patient's wife and advised that we received lab results, she will inform patient.

## 2020-01-05 ENCOUNTER — Other Ambulatory Visit: Payer: Self-pay | Admitting: Rheumatology

## 2020-01-07 ENCOUNTER — Telehealth: Payer: Self-pay | Admitting: *Deleted

## 2020-01-07 MED ORDER — HUMIRA PEN 40 MG/0.8ML ~~LOC~~ PNKT: 40.0000 mg | PEN_INJECTOR | SUBCUTANEOUS | 0 refills | Status: DC

## 2020-01-07 NOTE — Telephone Encounter (Signed)
Last Visit: 10/10/2019 Next Visit: 03/05/2020 Labs: 12/14/2019 CBC WNL BUN 23, BUN/creat ratio 29.1  Current Dose per office note 10/10/2019: Methotrexate 6 tablets by mouth once weekly and also documented as methotrexate 5 tablets by mouth once weekly.  Okay to refill MTX? And at which dose?

## 2020-01-07 NOTE — Telephone Encounter (Signed)
Spoke with patient's wife. Patient is currently taking 5 tabs of MTX weekly.

## 2020-01-07 NOTE — Telephone Encounter (Signed)
Please call the patient to clarify if the patient is taking 5 or 6 tablets of MTX weekly.

## 2020-01-07 NOTE — Telephone Encounter (Signed)
Patient's wife states patient needs a refill on Humira sent to [facility].  Last Visit: 10/10/2019 Next Visit: 03/05/2020 Labs: 12/14/2019 CBC WNL BUN 23, BUN/creat ratio 29.1 TB Gold: 12/14/2019 Neg   Okay to refill per Dr. Corliss Skains

## 2020-01-08 ENCOUNTER — Telehealth: Payer: Self-pay

## 2020-01-08 MED ORDER — FOLIC ACID 1 MG PO TABS: 2.0000 mg | ORAL_TABLET | Freq: Every day | ORAL | 3 refills | Status: DC

## 2020-01-08 NOTE — Telephone Encounter (Signed)
Received refill request from [facility] for folic acid.   Last Visit: 10/10/2019 Next Visit:03/05/2020  Okay to refill per Dr. Corliss Skains.

## 2020-02-05 ENCOUNTER — Other Ambulatory Visit: Payer: Self-pay | Admitting: Rheumatology

## 2020-02-05 NOTE — Telephone Encounter (Signed)
90-day supply of Humira was sent to the pharmacy on 01/07/20.

## 2020-02-05 NOTE — Telephone Encounter (Signed)
Last Visit: 10/10/2019 Next Visit: 03/05/2020 Labs: 12/14/2019 CBC WNL BUN 23, BUN/creat ratio 29.1 TB Gold: 12/12/2019 Neg   Okay to refill Humira?

## 2020-02-22 NOTE — Progress Notes (Signed)
Office Visit Note  Patient: Andrew Chapman             Date of Birth: 02/10/1971           MRN: [id number]             PCP: Lorelei Pont, DO Referring: Lorelei Pont, DO Visit Date: 03/05/2020 Occupation: @GUAROCC @  Subjective:  Psoriasis   History of Present Illness: Andrew Chapman is a 49 y.o. male with history of psoriatic arthritis.  He is on Humira 40 mg sq injections once every 14 days, MTX 5 tablets po once weekly, and folic acid 1 mg daily. He denies missing any doses recently.  He has been experiencing intermittent lower back pain which she states is most severe in the evenings.  He states that it feels as though he is having muscle spasms.  He states his pain subsides with magnesium malate but he has ran out of the supplement.  He has been taking Tylenol as needed for pain relief.  He denies any other joint pain or joint swelling at this time.  He states that he has a few patches of psoriasis on the extensor surface of both elbows and has been applying clobetasol cream topically as needed which resolves the patches.  He states that overall his psoriasis and joint pain have improved significantly on the combination of Humira and methotrexate.  Activities of Daily Living:  Patient reports morning stiffness for 5 minutes.   Patient Reports nocturnal pain.  Difficulty dressing/grooming: Denies Difficulty climbing stairs: Denies Difficulty getting out of chair: Denies Difficulty using hands for taps, buttons, cutlery, and/or writing: Denies  Review of Systems  Constitutional: Negative for fatigue and night sweats.  HENT: Negative for mouth sores, mouth dryness and nose dryness.   Eyes: Negative for redness and dryness.  Respiratory: Negative for shortness of breath and difficulty breathing.   Cardiovascular: Negative for chest pain, palpitations, hypertension, irregular heartbeat and swelling in legs/feet.  Gastrointestinal: Negative for constipation and diarrhea.  Endocrine:  Negative for increased urination.  Genitourinary: Negative for painful urination.  Musculoskeletal: Positive for arthralgias, joint pain, morning stiffness and muscle tenderness. Negative for joint swelling, myalgias, muscle weakness and myalgias.  Skin: Positive for rash (Psoriasis). Negative for color change, hair loss, nodules/bumps, skin tightness, ulcers and sensitivity to sunlight.  Allergic/Immunologic: Negative for susceptible to infections.  Neurological: Negative for dizziness, fainting, memory loss, night sweats and weakness.  Hematological: Negative for swollen glands.  Psychiatric/Behavioral: Negative for depressed mood and sleep disturbance. The patient is not nervous/anxious.     PMFS History:  Patient Active Problem List   Diagnosis Date Noted  . Family history of psoriatic arthritis 03/02/2017  . Psoriasis 03/01/2017  . Psoriatic arthropathy (HCC) 03/01/2017  . High risk medication use 03/01/2017  . History of gastroesophageal reflux (GERD) 03/01/2017  . 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 Arthritis   . Hypertension   . Psoriasis     Family History  Problem Relation Age of Onset  . Diabetes Mother    History reviewed. No pertinent surgical history. Social History   Social History Narrative  . Not on file    There is no immunization history on file for this patient.   Objective: Vital Signs: BP 135/84 (BP Location: Left Arm, Patient Position: Sitting, Cuff Size: Large)   Pulse 87   Resp 18   Ht 6\' 3"  (1.905 m)   Wt 300 lb (136.1  kg)   BMI 37.50 kg/m    Physical Exam Vitals and nursing note reviewed.  Constitutional:      Appearance: He is well-developed.  HENT:     Head: Normocephalic and atraumatic.  Eyes:     Conjunctiva/sclera: Conjunctivae normal.     Pupils: Pupils are equal, round, and reactive to light.  Pulmonary:     Effort: Pulmonary effort is normal.  Abdominal:     General: Bowel sounds  are normal.     Palpations: Abdomen is soft.  Musculoskeletal:     Cervical back: Normal range of motion and neck supple.  Skin:    General: Skin is warm and dry.     Capillary Refill: Capillary refill takes less than 2 seconds.     Comments: A few small patches of psoriasis on the extensor surface of both elbows.  1 small patch of psoriasis on the anterior surface of both knees.  Neurological:     Mental Status: He is alert and oriented to person, place, and time.  Psychiatric:        Behavior: Behavior normal.      Musculoskeletal Exam: C-spine, thoracic spine, and lumbar spine good ROM.  No midline spinal tenderness.  No SI joint tenderness.  He has tenderness in the paraspinal muscles in the lumbar region bilaterally.  Shoulder joints, elbow joints, wrist joints, MCPs, PIPs, and DIPs have good range of motion with no synovitis.  He has PIP and DIP thickening consistent with osteoarthritis of both hands.  Hip joints have good range of motion with no discomfort.  Knee joints have good range of motion with no warmth or effusion.  Ankle joints have good range of motion with no tenderness or inflammation.  CDAI Exam: CDAI Score: -- Patient Global: --; Provider Global: -- Swollen: --; Tender: -- Joint Exam 03/05/2020   No joint exam has been documented for this visit   There is currently no information documented on the homunculus. Go to the Rheumatology activity and complete the homunculus joint exam.  Investigation: No additional findings.  Imaging: No results found.  Recent Labs: Lab Results  Component Value Date   WBC 10.7 06/28/2018   HGB 15.1 06/28/2018   PLT 366 06/28/2018   NA 142 06/28/2018   K 4.4 06/28/2018   CL 106 06/28/2018   CO2 27 06/28/2018   GLUCOSE 79 06/28/2018   BUN 20 06/28/2018   CREATININE 1.02 06/28/2018   BILITOT 0.8 06/28/2018   ALKPHOS 74 06/27/2017   AST 22 06/28/2018   ALT 36 06/28/2018   PROT 7.4 06/28/2018   ALBUMIN 4.5 06/27/2017    CALCIUM 9.7 06/28/2018   GFRAA 102 06/28/2018   QFTBGOLDPLUS NEGATIVE 01/03/2018    Speciality Comments: No specialty comments available.  Procedures:  No procedures performed Allergies: Patient has no known allergies.       Assessment / Plan:     Visit Diagnoses: Psoriatic arthropathy (HCC): He has no synovitis or dactylitis on exam.  He has not had any recent psoriatic arthritis flares.  He is clinically doing well on Humira 40 mg subcutaneous injections once every 14 days, methotrexate 5 tablets by mouth once weekly, and folic acid 2 mg by mouth daily.  He has not missed any doses of Humira or methotrexate recently.  He has not had any Achilles tendinitis or plantar fasciitis.  He has no SI joint tenderness on exam today.  He has been experiencing lower back muscle spasms intermittently.  He was encouraged to  continue to take magnesium malate and was encouraged to perform back stretching exercises.  He will continue on Humira 40 mg subcutaneous injections once every 14 days, methotrexate 5 tablets by mouth once weekly, and folic acid 2 mg by mouth daily.  He does not need any refills at this time.  He was advised to notify us if he develops increased joint pain or joint swelling.  He will follow-up in the office in 5 months.  Psoriasis: He has a few small patches of psoriasis on the extensor surface of both elbows and 1 small patch of psoriasis on the anterior surface of both knees.  He has been applying a betazole cream topically as needed which has been resolving the patches.  He will continue on Humira and methotrexate as prescribed.  High risk medication use - Humira 40 mg subq injections every 14 days, methotrexate 5 tablets by mouth once weekly, and folic acid 2 mg daily.  TB gold negative on 12/12/19 and will be monitored yearly.  CBC and CMP were drawn on 12/12/2019.  We will update CBC and CMP today.  Orders were released.  His next office will be due in October and every 3 months to  monitor for drug toxicity.- Plan: COMPLETE METABOLIC PANEL WITH GFR, CBC with Differential/Platelet  Chronic SI joint pain: He does not have any SI joint tenderness to palpation.  Acute bilateral lower back pain without sciatica-he has been experiencing some discomfort in the lower back intermittently.  He had some muscle spasms in the lower lumbar paravertebral region.  Handout on back exercises given.  Family history of psoriatic arthritis  Orders: Orders Placed This Encounter  Procedures  . COMPLETE METABOLIC PANEL WITH GFR  . CBC with Differential/Platelet   No orders of the defined types were placed in this encounter.    Follow-Up Instructions: Return in about 5 months (around 08/05/2020) for Psoriatic arthritis.   Gearldine Bienenstock, PA-C   I examined and evaluated the patient with Sherron Ales PA.  His psoriatic arthritis is well controlled on Humira.  He had no synovitis on examination.  His main concern today was intermittent lower back pain for the last 3 weeks.  He had tenderness over the bilateral paraspinal lumbar region.  Core strengthening exercises were discussed.  Weight loss was discussed.  A handout on back exercises was given.  The plan of care was discussed as noted above.  Pollyann Savoy, MD  Note - This record has been created using Animal nutritionist.  Chart creation errors have been sought, but may not always  have been located. Such creation errors do not reflect on  the standard of medical care.

## 2020-03-05 ENCOUNTER — Encounter: Payer: Self-pay | Admitting: Rheumatology

## 2020-03-05 ENCOUNTER — Ambulatory Visit (INDEPENDENT_AMBULATORY_CARE_PROVIDER_SITE_OTHER): Payer: BC Managed Care – PPO | Admitting: Rheumatology

## 2020-03-05 ENCOUNTER — Other Ambulatory Visit: Payer: Self-pay

## 2020-03-05 VITALS — BP 135/84 | HR 87 | Resp 18 | Ht 75.0 in | Wt 300.0 lb

## 2020-03-05 DIAGNOSIS — Z840 Family history of diseases of the skin and subcutaneous tissue: Secondary | ICD-10-CM

## 2020-03-05 DIAGNOSIS — L409 Psoriasis, unspecified: Secondary | ICD-10-CM | POA: Diagnosis not present

## 2020-03-05 DIAGNOSIS — L4050 Arthropathic psoriasis, unspecified: Secondary | ICD-10-CM | POA: Diagnosis not present

## 2020-03-05 DIAGNOSIS — M533 Sacrococcygeal disorders, not elsewhere classified: Secondary | ICD-10-CM

## 2020-03-05 DIAGNOSIS — Z79899 Other long term (current) drug therapy: Secondary | ICD-10-CM

## 2020-03-05 DIAGNOSIS — Z8261 Family history of arthritis: Secondary | ICD-10-CM

## 2020-03-05 DIAGNOSIS — M545 Low back pain, unspecified: Secondary | ICD-10-CM

## 2020-03-05 DIAGNOSIS — G8929 Other chronic pain: Secondary | ICD-10-CM

## 2020-03-05 NOTE — Patient Instructions (Addendum)
Standing Labs We placed an order today for your standing lab work.   Please have your standing labs drawn in October and every 3 months   If possible, please have your labs drawn 2 weeks prior to your appointment so that the provider can discuss your results at your appointment.  We have open lab daily Monday through Thursday from 8:30-12:30 PM and 1:30-4:30 PM and Friday from 8:30-12:30 PM and 1:30-4:00 PM at the office of Dr. Pollyann Savoy, [facility].   Please be advised, patients with office appointments requiring lab work will take precedents over walk-in lab work.  If possible, please come for your lab work on Monday and Friday afternoons, as you may experience shorter wait times. The office is located at [address], [address], [place], [place] [address] No appointment is necessary.   Labs are drawn by [facility]. Please bring your co-pay at the time of your lab draw.  You may receive a bill from [facility] for your lab work.  If you wish to have your labs drawn at another location, please call the office 24 hours in advance to send orders.  If you have any questions regarding directions or hours of operation,  please call [phone].   As a reminder, please drink plenty of water prior to coming for your lab work. Thanks!   Back Exercises These exercises help to make your trunk and back strong. They also help to keep the lower back flexible. Doing these exercises can help to prevent back pain or lessen existing pain.  If you have back pain, try to do these exercises 2-3 times each day or as told by your doctor.  As you get better, do the exercises once each day. Repeat the exercises more often as told by your doctor.  To stop back pain from coming back, do the exercises once each day, or as told by your doctor. Exercises Single knee to chest Do these steps 3-5 times in a row for each leg: 1. Lie on your back on a firm bed or the floor with your legs stretched  out. 2. Bring one knee to your chest. 3. Grab your knee or thigh with both hands and hold them it in place. 4. Pull on your knee until you feel a gentle stretch in your lower back or buttocks. 5. Keep doing the stretch for 10-30 seconds. 6. Slowly let go of your leg and straighten it. Pelvic tilt Do these steps 5-10 times in a row: 1. Lie on your back on a firm bed or the floor with your legs stretched out. 2. Bend your knees so they point up to the ceiling. Your feet should be flat on the floor. 3. Tighten your lower belly (abdomen) muscles to press your lower back against the floor. This will make your tailbone point up to the ceiling instead of pointing down to your feet or the floor. 4. Stay in this position for 5-10 seconds while you gently tighten your muscles and breathe evenly. Cat-cow Do these steps until your lower back bends more easily: 1. Get on your hands and knees on a firm surface. Keep your hands under your shoulders, and keep your knees under your hips. You may put padding under your knees. 2. Let your head hang down toward your chest. Tighten (contract) the muscles in your belly. Point your tailbone toward the floor so your lower back becomes rounded like the back of a cat. 3. Stay in this position for 5 seconds.  4. Slowly lift your head. Let the muscles of your belly relax. Point your tailbone up toward the ceiling so your back forms a sagging arch like the back of a cow. 5. Stay in this position for 5 seconds.  Press-ups Do these steps 5-10 times in a row: 1. Lie on your belly (face-down) on the floor. 2. Place your hands near your head, about shoulder-width apart. 3. While you keep your back relaxed and keep your hips on the floor, slowly straighten your arms to raise the top half of your body and lift your shoulders. Do not use your back muscles. You may change where you place your hands in order to make yourself more comfortable. 4. Stay in this position for 5  seconds. 5. Slowly return to lying flat on the floor.  Bridges Do these steps 10 times in a row: 1. Lie on your back on a firm surface. 2. Bend your knees so they point up to the ceiling. Your feet should be flat on the floor. Your arms should be flat at your sides, next to your body. 3. Tighten your butt muscles and lift your butt off the floor until your waist is almost as high as your knees. If you do not feel the muscles working in your butt and the back of your thighs, slide your feet 1-2 inches farther away from your butt. 4. Stay in this position for 3-5 seconds. 5. Slowly lower your butt to the floor, and let your butt muscles relax. If this exercise is too easy, try doing it with your arms crossed over your chest. Belly crunches Do these steps 5-10 times in a row: 1. Lie on your back on a firm bed or the floor with your legs stretched out. 2. Bend your knees so they point up to the ceiling. Your feet should be flat on the floor. 3. Cross your arms over your chest. 4. Tip your chin a little bit toward your chest but do not bend your neck. 5. Tighten your belly muscles and slowly raise your chest just enough to lift your shoulder blades a tiny bit off of the floor. Avoid raising your body higher than that, because it can put too much stress on your low back. 6. Slowly lower your chest and your head to the floor. Back lifts Do these steps 5-10 times in a row: 1. Lie on your belly (face-down) with your arms at your sides, and rest your forehead on the floor. 2. Tighten the muscles in your legs and your butt. 3. Slowly lift your chest off of the floor while you keep your hips on the floor. Keep the back of your head in line with the curve in your back. Look at the floor while you do this. 4. Stay in this position for 3-5 seconds. 5. Slowly lower your chest and your face to the floor. Contact a doctor if:  Your back pain gets a lot worse when you do an exercise.  Your back pain does  not get better 2 hours after you exercise. If you have any of these problems, stop doing the exercises. Do not do them again unless your doctor says it is okay. Get help right away if:  You have sudden, very bad back pain. If this happens, stop doing the exercises. Do not do them again unless your doctor says it is okay. This information is not intended to replace advice given to you by your health care provider. Make sure you discuss  any questions you have with your health care provider. Document Revised: 05/04/2018 Document Reviewed: 05/04/2018 Elsevier Patient Education  2020 [employer].

## 2020-03-06 LAB — CBC WITH DIFFERENTIAL/PLATELET
Absolute Monocytes: 1231 cells/uL — ABNORMAL HIGH (ref 200–950)
Basophils Absolute: 105 cells/uL (ref 0–200)
Basophils Relative: 0.8 %
Eosinophils Absolute: 144 cells/uL (ref 15–500)
Eosinophils Relative: 1.1 %
HCT: 44.6 % (ref 38.5–50.0)
Hemoglobin: 15.2 g/dL (ref 13.2–17.1)
Lymphs Abs: 2266 cells/uL (ref 850–3900)
MCH: 31 pg (ref 27.0–33.0)
MCHC: 34.1 g/dL (ref 32.0–36.0)
MCV: 91 fL (ref 80.0–100.0)
MPV: 9.4 fL (ref 7.5–12.5)
Monocytes Relative: 9.4 %
Neutro Abs: 9353 cells/uL — ABNORMAL HIGH (ref 1500–7800)
Neutrophils Relative %: 71.4 %
Platelets: 398 10*3/uL (ref 140–400)
RBC: 4.9 10*6/uL (ref 4.20–5.80)
RDW: 13.1 % (ref 11.0–15.0)
Total Lymphocyte: 17.3 %
WBC: 13.1 10*3/uL — ABNORMAL HIGH (ref 3.8–10.8)

## 2020-03-06 LAB — COMPLETE METABOLIC PANEL WITH GFR
AG Ratio: 1.7 (calc) (ref 1.0–2.5)
ALT: 36 U/L (ref 9–46)
AST: 21 U/L (ref 10–40)
Albumin: 4.7 g/dL (ref 3.6–5.1)
Alkaline phosphatase (APISO): 73 U/L (ref 36–130)
BUN: 23 mg/dL (ref 7–25)
CO2: 26 mmol/L (ref 20–32)
Calcium: 9.5 mg/dL (ref 8.6–10.3)
Chloride: 106 mmol/L (ref 98–110)
Creat: 1.11 mg/dL (ref 0.60–1.35)
GFR, Est African American: 91 mL/min/{1.73_m2} (ref >=60)
GFR, Est Non African American: 78 mL/min/{1.73_m2} (ref >=60)
Globulin: 2.7 g/dL (calc) (ref 1.9–3.7)
Glucose, Bld: 84 mg/dL (ref 65–99)
Potassium: 4.2 mmol/L (ref 3.5–5.3)
Sodium: 141 mmol/L (ref 135–146)
Total Bilirubin: 0.5 mg/dL (ref 0.2–1.2)
Total Protein: 7.4 g/dL (ref 6.1–8.1)

## 2020-03-06 NOTE — Progress Notes (Signed)
CMP WNL.  WBC count is elevated-13.1. Absolute neutrophils and absolute monocytes are elevated.  Please call the patient to see if he has had any recent infections.

## 2020-04-08 ENCOUNTER — Other Ambulatory Visit: Payer: Self-pay | Admitting: Rheumatology

## 2020-04-08 NOTE — Telephone Encounter (Signed)
Last Visit: 03/05/2020 Next Visit: 08/07/2020 Labs: 03/05/2020 CMP WNL. WBC count is elevated-13.1. Absolute neutrophils and absolute monocytes are elevated  Current Dose per office note 03/05/2020: methotrexate 5 tablets by mouth once weekly, DX: Psoriatic arthropathy   Okay to refill MTX?

## 2020-04-30 ENCOUNTER — Other Ambulatory Visit: Payer: Self-pay | Admitting: *Deleted

## 2020-04-30 MED ORDER — HUMIRA PEN 40 MG/0.8ML ~~LOC~~ PNKT: 40.0000 mg | PEN_INJECTOR | SUBCUTANEOUS | 0 refills | Status: DC

## 2020-04-30 NOTE — Telephone Encounter (Signed)
Refill request received via fax  Last Visit: 03/05/2020 Next Visit: 08/07/2020 Labs: 03/05/2020 CMP WNL. WBC count is elevated-13.1. Absolute neutrophils and absolute monocytes are elevated TB Gold: 12/18/2019  Current Dose per office note on 03/05/2020: Humira 40 mg subcutaneous injections once every 14 days  Okay to refill Humira?

## 2020-05-01 ENCOUNTER — Other Ambulatory Visit: Payer: Self-pay | Admitting: Rheumatology

## 2020-07-01 ENCOUNTER — Encounter: Payer: Self-pay | Admitting: Rheumatology

## 2020-07-04 ENCOUNTER — Telehealth: Payer: Self-pay | Admitting: *Deleted

## 2020-07-04 NOTE — Telephone Encounter (Signed)
Received labs from PCP Reviewed by Sherron Ales, PA-C Drawn on 05/21/2020  LDL Chol Calc 107 CMP WNL  Patient on Humira 40 mg SQ every 14 days and MTX 5 tabs weekly.

## 2020-07-07 ENCOUNTER — Telehealth: Payer: Self-pay | Admitting: *Deleted

## 2020-07-07 DIAGNOSIS — Z79899 Other long term (current) drug therapy: Secondary | ICD-10-CM

## 2020-07-07 NOTE — Telephone Encounter (Signed)
Lab Orders faxed

## 2020-07-11 ENCOUNTER — Other Ambulatory Visit: Payer: Self-pay | Admitting: Physician Assistant

## 2020-07-11 ENCOUNTER — Telehealth: Payer: Self-pay | Admitting: *Deleted

## 2020-07-11 NOTE — Telephone Encounter (Signed)
Labs received from [facility], DO Drawn on 07/07/2020 Reviewed by Sherron Ales, PA-C  CBC Polys, ABS. Count 7.03

## 2020-07-11 NOTE — Telephone Encounter (Signed)
Last Visit: 03/05/2020 Next Visit: 08/07/2020 Labs: CBC 07/07/2020 Polys, ABS Count 7.03 CMP WNL   Current Dose per office note 03/05/2020: methotrexate 5 tablets by mouth once weekly, DX: Psoriatic arthropathy   Okay to refill MTX?

## 2020-07-28 NOTE — Progress Notes (Deleted)
   Office Visit Note  Patient: Andrew Chapman             Date of Birth: 11/08/1970           MRN: [id number]             PCP: Lorelei Pont, DO Referring: Lorelei Pont, DO Visit Date: 08/07/2020 Occupation: @GUAROCC @  Subjective:  No chief complaint on file.   History of Present Illness: Andrew Chapman is a 49 y.o. male ***   Activities of Daily Living:  Patient reports morning stiffness for *** {minute/hour:19697}.   Patient {ACTIONS;DENIES/REPORTS:21021675::"Denies"} nocturnal pain.  Difficulty dressing/grooming: {ACTIONS;DENIES/REPORTS:21021675::"Denies"} Difficulty climbing stairs: {ACTIONS;DENIES/REPORTS:21021675::"Denies"} Difficulty getting out of chair: {ACTIONS;DENIES/REPORTS:21021675::"Denies"} Difficulty using hands for taps, buttons, cutlery, and/or writing: {ACTIONS;DENIES/REPORTS:21021675::"Denies"}  No Rheumatology ROS completed.   PMFS History:  Patient Active Problem List   Diagnosis Date Noted  . Family history of psoriatic arthritis 03/02/2017  . Psoriasis 03/01/2017  . Psoriatic arthropathy (HCC) 03/01/2017  . High risk medication use 03/01/2017  . History of gastroesophageal reflux (GERD) 03/01/2017  . 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 Arthritis   . Hypertension   . Psoriasis     Family History  Problem Relation Age of Onset  . Diabetes Mother    No past surgical history on file. Social History   Social History Narrative  . Not on file    There is no immunization history on file for this patient.   Objective: Vital Signs: There were no vitals taken for this visit.   Physical Exam   Musculoskeletal Exam: ***  CDAI Exam: CDAI Score: - Patient Global: -; Provider Global: - Swollen: -; Tender: - Joint Exam 08/07/2020   No joint exam has been documented for this visit   There is currently no information documented on the homunculus. Go to the Rheumatology activity and complete the  homunculus joint exam.  Investigation: No additional findings.  Imaging: No results found.  Recent Labs: Lab Results  Component Value Date   WBC 13.1 (H) 03/05/2020   HGB 15.2 03/05/2020   PLT 398 03/05/2020   NA 141 03/05/2020   K 4.2 03/05/2020   CL 106 03/05/2020   CO2 26 03/05/2020   GLUCOSE 84 03/05/2020   BUN 23 03/05/2020   CREATININE 1.11 03/05/2020   BILITOT 0.5 03/05/2020   ALKPHOS 74 06/27/2017   AST 21 03/05/2020   ALT 36 03/05/2020   PROT 7.4 03/05/2020   ALBUMIN 4.5 06/27/2017   CALCIUM 9.5 03/05/2020   GFRAA 91 03/05/2020   QFTBGOLDPLUS NEGATIVE 01/03/2018    Speciality Comments: No specialty comments available.  Procedures:  No procedures performed Allergies: Patient has no known allergies.   Assessment / Plan:     Visit Diagnoses: No diagnosis found.  Orders: No orders of the defined types were placed in this encounter.  No orders of the defined types were placed in this encounter.   Face-to-face time spent with patient was *** minutes. Greater than 50% of time was spent in counseling and coordination of care.  Follow-Up Instructions: No follow-ups on file.   01/05/2018, CMA  Note - This record has been created using Ellen Henri.  Chart creation errors have been sought, but may not always  have been located. Such creation errors do not reflect on  the standard of medical care.

## 2020-08-07 ENCOUNTER — Ambulatory Visit: Payer: BC Managed Care – PPO | Admitting: Rheumatology

## 2020-08-07 DIAGNOSIS — L409 Psoriasis, unspecified: Secondary | ICD-10-CM

## 2020-08-07 DIAGNOSIS — L4050 Arthropathic psoriasis, unspecified: Secondary | ICD-10-CM

## 2020-08-07 DIAGNOSIS — G8929 Other chronic pain: Secondary | ICD-10-CM

## 2020-08-07 DIAGNOSIS — Z79899 Other long term (current) drug therapy: Secondary | ICD-10-CM

## 2020-08-07 DIAGNOSIS — Z8261 Family history of arthritis: Secondary | ICD-10-CM

## 2020-08-07 DIAGNOSIS — M545 Low back pain, unspecified: Secondary | ICD-10-CM

## 2020-09-15 ENCOUNTER — Telehealth: Payer: Self-pay | Admitting: Pharmacist

## 2020-09-15 NOTE — Telephone Encounter (Signed)
Received notification from [employer] regarding a prior authorization for HUMIRA. Authorization has been APPROVED from 08/23/20 to 09/12/21.   Authorization # [id number] Phone # [phone]

## 2020-09-22 NOTE — Progress Notes (Signed)
Office Visit Note  Patient: Andrew Chapman             Date of Birth: [birth year]           MRN: [id number]             PCP: Lorelei Pont, DO Referring: Lorelei Pont, DO Visit Date: 09/23/2020 Occupation: @GUAROCC @  Subjective:  Medication management.   History of Present Illness: Andrew Chapman is a 50 y.o. male with history of psoriasis, psoriatic arthritis and osteoarthritis.  He denies any joint pain or joint stiffness.  He states his SI joint pain is improved.  He has some rash over his elbows and knee joints for which she uses topical agents on as needed basis.  He states that he has not problems with kidney stones in the past and recently had another episode.  He did not pass the kidney stone.  Activities of Daily Living:  Patient reports morning stiffness for 0 minutes.   Patient Denies nocturnal pain.  Difficulty dressing/grooming: Denies Difficulty climbing stairs: Denies Difficulty getting out of chair: Denies Difficulty using hands for taps, buttons, cutlery, and/or writing: Denies  Review of Systems  Constitutional: Negative for fatigue.  HENT: Negative for mouth sores, mouth dryness and nose dryness.   Eyes: Negative for pain, itching and dryness.  Respiratory: Negative for shortness of breath and difficulty breathing.   Cardiovascular: Negative for chest pain and palpitations.  Gastrointestinal: Negative for blood in stool, constipation and diarrhea.  Endocrine: Negative for increased urination.  Genitourinary: Negative for difficulty urinating.  Musculoskeletal: Negative for arthralgias, joint pain, joint swelling, myalgias, morning stiffness, muscle tenderness and myalgias.  Skin: Positive for rash. Negative for color change and redness.  Allergic/Immunologic: Negative for susceptible to infections.  Neurological: Negative for dizziness, numbness, headaches, memory loss and weakness.  Hematological: Negative for bruising/bleeding tendency.   Psychiatric/Behavioral: Negative for confusion and sleep disturbance.    PMFS History:  Patient Active Problem List   Diagnosis Date Noted  . Family history of psoriatic arthritis 03/02/2017  . Psoriasis 03/01/2017  . Psoriatic arthropathy (HCC) 03/01/2017  . High risk medication use 03/01/2017  . History of gastroesophageal reflux (GERD) 03/01/2017  . 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 Arthritis   . Hypertension   . Psoriasis     Family History  Problem Relation Age of Onset  . Diabetes Mother    History reviewed. No pertinent surgical history. Social History   Social History Narrative  . Not on file   Immunization History  Administered Date(s) Administered  . Moderna Sars-Covid-2 Vaccination 11/08/2019, 12/11/2019, 07/02/2020     Objective: Vital Signs: BP (!) 151/96 (BP Location: Right Arm, Patient Position: Sitting, Cuff Size: Large)   Pulse 88   Resp 18   Ht 6\' 3"  (1.905 m)   Wt 292 lb (132.5 kg)   BMI 36.50 kg/m    Physical Exam Vitals and nursing note reviewed.  Constitutional:      Appearance: He is well-developed and well-nourished.  HENT:     Head: Normocephalic and atraumatic.  Eyes:     Extraocular Movements: EOM normal.     Conjunctiva/sclera: Conjunctivae normal.     Pupils: Pupils are equal, round, and reactive to light.  Cardiovascular:     Rate and Rhythm: Normal rate and regular rhythm.     Heart sounds: Normal heart sounds.  Pulmonary:     Effort: Pulmonary effort is normal.  Breath sounds: Normal breath sounds.  Abdominal:     General: Bowel sounds are normal.     Palpations: Abdomen is soft.  Musculoskeletal:     Cervical back: Normal range of motion and neck supple.  Skin:    General: Skin is warm and dry.     Capillary Refill: Capillary refill takes less than 2 seconds.     Comments: Mild psoriasis patches were noted over bilateral elbows and knee joints.  Neurological:      Mental Status: He is alert and oriented to person, place, and time.  Psychiatric:        Mood and Affect: Mood and affect normal.        Behavior: Behavior normal.      Musculoskeletal Exam: C-spine thoracic and lumbar spine with good range of motion.  Shoulder joints, elbow joints, wrist joints with good range of motion.  He has some DIP and PIP thickening with no synovitis.  Hip joints, knee joints, ankles, good range of motion.  He had no tenderness over ankles or MTPs.  No synovitis or dactylitis was noted.  There was no evidence of Achilles tendinitis or plantar fasciitis.  CDAI Exam: CDAI Score: -- Patient Global: --; Provider Global: -- Swollen: --; Tender: -- Joint Exam 09/23/2020   No joint exam has been documented for this visit   There is currently no information documented on the homunculus. Go to the Rheumatology activity and complete the homunculus joint exam.  Investigation: No additional findings.  Imaging: No results found.  Recent Labs: Lab Results  Component Value Date   WBC 13.1 (H) 03/05/2020   HGB 15.2 03/05/2020   PLT 398 03/05/2020   NA 141 03/05/2020   K 4.2 03/05/2020   CL 106 03/05/2020   CO2 26 03/05/2020   GLUCOSE 84 03/05/2020   BUN 23 03/05/2020   CREATININE 1.11 03/05/2020   BILITOT 0.5 03/05/2020   ALKPHOS 74 06/27/2017   AST 21 03/05/2020   ALT 36 03/05/2020   PROT 7.4 03/05/2020   ALBUMIN 4.5 06/27/2017   CALCIUM 9.5 03/05/2020   GFRAA 91 03/05/2020   QFTBGOLDPLUS NEGATIVE 01/03/2018    Speciality Comments: No specialty comments available.  Procedures:  No procedures performed Allergies: Patient has no known allergies.   Assessment / Plan:     Visit Diagnoses: Psoriatic arthropathy (HCC) -he denies any joint pain or joint swelling today.  There is no history of dactylitis, Achilles tendinitis or plantar fasciitis.  He has noticed improvement in the SI joint pain.  He has been doing well on combination of Humira and  methotrexate.  To monitor for disease progression we obtained x-rays today.  Plan: XR Hand 2 View Right, XR Hand 2 View Left, XR Foot 2 Views Right, XR Foot 2 Views Left.  X-rays were consistent with osteoarthritis.  No radiographic progression was noted.  Psoriasis-he has some psoriasis patches.  He uses clobetasol on as needed basis.  High risk medication use - Humira 40 mg subq injections every 14 days, methotrexate 5 tablets by mouth once weekly, and folic acid 2 mg daily.   -His labs have been stable.  We will check labs today and then every 3 months to monitor for drug toxicity.  Plan: CBC with Differential/Platelet, COMPLETE METABOLIC PANEL WITH GFR, QuantiFERON-TB Gold Plus  Chronic SI joint pain-improved.  Family history of psoriatic arthritis  History of kidney stones -he gives history of recurrent kidney stones.  Dietary modifications were discussed.  Plan: Uric acid  Essential hypertension-his blood pressure is elevated today.  He is on ACE inhibitor's.  I advised him to monitor his blood pressure closely and follow-up with his PCP.  Dietary modifications were discussed.  Class 2 obesity due to excess calories without serious comorbidity with body mass index (BMI) of 36.0 to 36.9 in adult-association of heart disease with psoriatic arthritis was also discussed.  Weight loss diet and exercise was emphasized.  Handout was placed in the AVS.  Educated but COVID-19 virus infection-he received 3 vaccines of COVID-19.  I have advised him to get a fourth dose 6 months from his third dose.  Use of mask, social distancing and hand hygiene was discussed.  Use of monoclonal antibodies were discussed in case he develops COVID-19 infection.  Orders: Orders Placed This Encounter  Procedures  . XR Hand 2 View Right  . XR Hand 2 View Left  . XR Foot 2 Views Right  . XR Foot 2 Views Left  . CBC with Differential/Platelet  . COMPLETE METABOLIC PANEL WITH GFR  . QuantiFERON-TB Gold Plus  . Uric  acid   No orders of the defined types were placed in this encounter.    Follow-Up Instructions: Return in about 5 months (around 02/20/2021) for Psoriatic arthritis.   Pollyann Savoy, MD  Note - This record has been created using Animal nutritionist.  Chart creation errors have been sought, but may not always  have been located. Such creation errors do not reflect on  the standard of medical care.

## 2020-09-23 ENCOUNTER — Ambulatory Visit: Payer: Self-pay

## 2020-09-23 ENCOUNTER — Other Ambulatory Visit: Payer: Self-pay

## 2020-09-23 ENCOUNTER — Encounter: Payer: Self-pay | Admitting: Rheumatology

## 2020-09-23 ENCOUNTER — Ambulatory Visit (INDEPENDENT_AMBULATORY_CARE_PROVIDER_SITE_OTHER): Payer: BC Managed Care – PPO | Admitting: Rheumatology

## 2020-09-23 VITALS — BP 151/96 | HR 88 | Resp 18 | Ht 75.0 in | Wt 292.0 lb

## 2020-09-23 DIAGNOSIS — M533 Sacrococcygeal disorders, not elsewhere classified: Secondary | ICD-10-CM | POA: Diagnosis not present

## 2020-09-23 DIAGNOSIS — M545 Low back pain, unspecified: Secondary | ICD-10-CM

## 2020-09-23 DIAGNOSIS — L4050 Arthropathic psoriasis, unspecified: Secondary | ICD-10-CM | POA: Diagnosis not present

## 2020-09-23 DIAGNOSIS — G8929 Other chronic pain: Secondary | ICD-10-CM

## 2020-09-23 DIAGNOSIS — M79672 Pain in left foot: Secondary | ICD-10-CM | POA: Diagnosis not present

## 2020-09-23 DIAGNOSIS — M79671 Pain in right foot: Secondary | ICD-10-CM | POA: Diagnosis not present

## 2020-09-23 DIAGNOSIS — Z79899 Other long term (current) drug therapy: Secondary | ICD-10-CM

## 2020-09-23 DIAGNOSIS — M79642 Pain in left hand: Secondary | ICD-10-CM | POA: Diagnosis not present

## 2020-09-23 DIAGNOSIS — M79641 Pain in right hand: Secondary | ICD-10-CM | POA: Diagnosis not present

## 2020-09-23 DIAGNOSIS — E6609 Other obesity due to excess calories: Secondary | ICD-10-CM

## 2020-09-23 DIAGNOSIS — L409 Psoriasis, unspecified: Secondary | ICD-10-CM

## 2020-09-23 DIAGNOSIS — Z8261 Family history of arthritis: Secondary | ICD-10-CM

## 2020-09-23 DIAGNOSIS — Z840 Family history of diseases of the skin and subcutaneous tissue: Secondary | ICD-10-CM

## 2020-09-23 DIAGNOSIS — I10 Essential (primary) hypertension: Secondary | ICD-10-CM

## 2020-09-23 DIAGNOSIS — Z6836 Body mass index (BMI) 36.0-36.9, adult: Secondary | ICD-10-CM

## 2020-09-23 DIAGNOSIS — Z7189 Other specified counseling: Secondary | ICD-10-CM

## 2020-09-23 DIAGNOSIS — Z87442 Personal history of urinary calculi: Secondary | ICD-10-CM | POA: Insufficient documentation

## 2020-09-23 NOTE — Patient Instructions (Addendum)
Standing Labs We placed an order today for your standing lab work.   Please have your standing labs drawn in May and every 3 months  If possible, please have your labs drawn 2 weeks prior to your appointment so that the provider can discuss your results at your appointment.  We have open lab daily Monday through Thursday from 8:30-12:30 PM and 1:30-4:30 PM and Friday from 8:30-12:30 PM and 1:30-4:00 PM at the office of Dr. Pollyann Savoy, [facility].   Please be advised, all patients with office appointments requiring lab work will take precedents over walk-in lab work.  If possible, please come for your lab work on Monday and Friday afternoons, as you may experience shorter wait times. The office is located at [address], [place], [place] [address] No appointment is necessary.   Labs are drawn by [facility]. Please bring your co-pay at the time of your lab draw.  You may receive a bill from [facility] for your lab work.  If you wish to have your labs drawn at another location, please call the office 24 hours in advance to send orders.  If you have any questions regarding directions or hours of operation,  please call [phone].   As a reminder, please drink plenty of water prior to coming for your lab work. Thanks!    COVID-19 vaccine recommendations:   COVID-19 vaccine is recommended for everyone (unless you are allergic to a vaccine component), even if you are on a medication that suppresses your immune system.   If you are on Methotrexate, Cellcept (mycophenolate), Rinvoq, Harriette Ohara, and Olumiant- hold the medication for 1 week after each vaccine. Hold Methotrexate for 2 weeks after the single dose COVID-19 vaccine.   For individuals on immunosuppressive therapy it is recommended to get COVID-19 vaccine 3 doses 1 month apart and then a fourth dose (booster) 6 months after the third dose.  Do not take Tylenol or any anti-inflammatory medications (NSAIDs) 24  hours prior to the COVID-19 vaccination.   There is no direct evidence about the efficacy of the COVID-19 vaccine in individuals who are on medications that suppress the immune system.   Even if you are fully vaccinated, and you are on any medications that suppress your immune system, please continue to wear a mask, maintain at least six feet social distance and practice hand hygiene.   If you develop a COVID-19 infection, please contact your PCP or our office to determine if you need monoclonal antibody infusion.  The booster vaccine is now available for immunocompromised patients.   Please see the following web sites for updated information.   [email or web address]://[email or web address]/[email or web address]-Patient-Resources.[email or web address]   Heart Disease Prevention   Your inflammatory disease increases your risk of heart disease which includes heart attack, stroke, atrial fibrillation (irregular heartbeats), high blood pressure, heart failure and atherosclerosis (plaque in the arteries).  It is important to reduce your risk by:   . Keep blood pressure, cholesterol, and blood sugar at healthy levels   . Smoking Cessation   . Maintain a healthy weight  o BMI 20-25   . Eat a healthy diet  o Plenty of fresh fruit, vegetables, and whole grains  o Limit saturated fats, foods high in sodium, and added sugars  o DASH and Mediterranean diet   . Increase physical activity  o Recommend moderate physically activity for 150 minutes per week/ 30 minutes a day for five days a week These can be broken up into three separate ten-minute sessions during the  day.   . Reduce Stress  . Meditation, slow breathing exercises, yoga, coloring books  . Dental visits twice a year

## 2020-09-24 LAB — COMPLETE METABOLIC PANEL WITH GFR
AG Ratio: 1.8 (calc) (ref 1.0–2.5)
ALT: 28 U/L (ref 9–46)
AST: 20 U/L (ref 10–40)
Albumin: 4.6 g/dL (ref 3.6–5.1)
Alkaline phosphatase (APISO): 78 U/L (ref 36–130)
BUN: 18 mg/dL (ref 7–25)
CO2: 28 mmol/L (ref 20–32)
Calcium: 9.4 mg/dL (ref 8.6–10.3)
Chloride: 106 mmol/L (ref 98–110)
Creat: 0.81 mg/dL (ref 0.60–1.35)
GFR, Est African American: 122 mL/min/{1.73_m2} (ref >=60)
GFR, Est Non African American: 105 mL/min/{1.73_m2} (ref >=60)
Globulin: 2.6 g/dL (calc) (ref 1.9–3.7)
Glucose, Bld: 85 mg/dL (ref 65–99)
Potassium: 4.4 mmol/L (ref 3.5–5.3)
Sodium: 141 mmol/L (ref 135–146)
Total Bilirubin: 0.4 mg/dL (ref 0.2–1.2)
Total Protein: 7.2 g/dL (ref 6.1–8.1)

## 2020-09-24 LAB — CBC WITH DIFFERENTIAL/PLATELET
Absolute Monocytes: 1074 cells/uL — ABNORMAL HIGH (ref 200–950)
Basophils Absolute: 106 cells/uL (ref 0–200)
Basophils Relative: 0.9 %
Eosinophils Absolute: 165 cells/uL (ref 15–500)
Eosinophils Relative: 1.4 %
HCT: 44.4 % (ref 38.5–50.0)
Hemoglobin: 15.2 g/dL (ref 13.2–17.1)
Lymphs Abs: 2584 cells/uL (ref 850–3900)
MCH: 30.7 pg (ref 27.0–33.0)
MCHC: 34.2 g/dL (ref 32.0–36.0)
MCV: 89.7 fL (ref 80.0–100.0)
MPV: 9.5 fL (ref 7.5–12.5)
Monocytes Relative: 9.1 %
Neutro Abs: 7871 cells/uL — ABNORMAL HIGH (ref 1500–7800)
Neutrophils Relative %: 66.7 %
Platelets: 408 10*3/uL — ABNORMAL HIGH (ref 140–400)
RBC: 4.95 10*6/uL (ref 4.20–5.80)
RDW: 13.1 % (ref 11.0–15.0)
Total Lymphocyte: 21.9 %
WBC: 11.8 10*3/uL — ABNORMAL HIGH (ref 3.8–10.8)

## 2020-09-24 LAB — URIC ACID: Uric Acid, Serum: 5.6 mg/dL (ref 4.0–8.0)

## 2020-09-24 NOTE — Progress Notes (Signed)
White cell count and platelets are mildly elevated but stable. CMP is normal. Uric acid is in desirable range.

## 2020-10-25 ENCOUNTER — Other Ambulatory Visit: Payer: Self-pay | Admitting: Physician Assistant

## 2020-10-27 NOTE — Telephone Encounter (Signed)
Last Visit: 09/23/2020 Next Visit: 03/24/2021 Labs: 09/23/2020, White cell count and platelets are mildly elevated but stable. CMP is normal. Uric acid is in desirable range.  Current Dose per office note 09/23/2020, methotrexate 5 tablets by mouth once weekly DX: Psoriatic arthropathy   Last Fill: 07/11/2020  Okay to refill MTX?

## 2020-11-27 ENCOUNTER — Other Ambulatory Visit: Payer: Self-pay | Admitting: Physician Assistant

## 2020-11-27 NOTE — Telephone Encounter (Signed)
Next Visit: 03/24/2021  Last Visit: 09/23/2020  Last Fill: 04/30/2020  DX: Psoriatic arthropathy   Current Dose per office note 09/23/2020, Humira 40 mg subq injections every 14 days  Labs: 09/23/2020, White cell count and platelets are mildly elevated but stable. CMP is normal. Uric acid is in desirable range.  TB Gold: 12/14/2019, negative  Okay to refill Humira?

## 2021-01-07 ENCOUNTER — Telehealth: Payer: Self-pay

## 2021-01-07 ENCOUNTER — Other Ambulatory Visit: Payer: Self-pay | Admitting: *Deleted

## 2021-01-07 DIAGNOSIS — L409 Psoriasis, unspecified: Secondary | ICD-10-CM

## 2021-01-07 DIAGNOSIS — Z79899 Other long term (current) drug therapy: Secondary | ICD-10-CM

## 2021-01-07 DIAGNOSIS — L4050 Arthropathic psoriasis, unspecified: Secondary | ICD-10-CM

## 2021-01-07 NOTE — Addendum Note (Signed)
Addended by: Adylin Hankey H on: 01/07/2021 02:23 PM   Modules accepted: Orders  

## 2021-01-07 NOTE — Telephone Encounter (Signed)
Faxed to [phone]

## 2021-01-07 NOTE — Addendum Note (Signed)
Addended by: Audrie Lia on: 01/07/2021 02:23 PM   Modules accepted: Orders

## 2021-01-07 NOTE — Telephone Encounter (Signed)
Patient called requesting his labwork orders be sent to Dr. Lawson Fiscal office.  Patient states the office uses [facility].

## 2021-01-07 NOTE — Addendum Note (Signed)
Addended by: Audrie Lia on: 01/07/2021 02:22 PM   Modules accepted: Orders

## 2021-01-13 ENCOUNTER — Telehealth: Payer: Self-pay

## 2021-01-13 DIAGNOSIS — Z79899 Other long term (current) drug therapy: Secondary | ICD-10-CM

## 2021-01-13 DIAGNOSIS — Z111 Encounter for screening for respiratory tuberculosis: Secondary | ICD-10-CM

## 2021-01-13 NOTE — Telephone Encounter (Signed)
Patient called stating Dr. Thomes Lolling office did not receive his labwork orders.  Patient states he was given a different fax number and requested they be sent again.  Fax #[phone]

## 2021-01-13 NOTE — Telephone Encounter (Signed)
Lab orders have been re-faxed to number provided by the patient. I attempted to contact patient and # [phone] says it is not in service and #[phone] has a voicemail box that is not set up yet.

## 2021-02-05 ENCOUNTER — Telehealth: Payer: Self-pay | Admitting: *Deleted

## 2021-02-05 NOTE — Telephone Encounter (Signed)
Labs received from: [facility]  Drawn on:01/26/2021  Reviewed by:Sherron Ales, PA-C  Labs drawn:CBC/CMP/TB Gold  Results:Glucose 107, CO2 21, TB Gold Negative  Patient on MTX 5 tabs weekly and Humira 40 mg SQ every 14 days.

## 2021-02-12 ENCOUNTER — Other Ambulatory Visit: Payer: Self-pay | Admitting: Physician Assistant

## 2021-02-12 ENCOUNTER — Other Ambulatory Visit: Payer: Self-pay | Admitting: Rheumatology

## 2021-02-12 ENCOUNTER — Telehealth: Payer: Self-pay | Admitting: Rheumatology

## 2021-02-12 NOTE — Telephone Encounter (Signed)
Next Visit: 03/24/2021   Last Visit: 09/23/2020   Last Fill: 10/27/2020  DX: Psoriatic arthropathy    Current Dose per office note 09/23/2020, methotrexate 5 tablets by mouth once weekly  Labs: 01/26/2021 Glucose 107, CO2 21  Okay to refill MTX?

## 2021-02-12 NOTE — Telephone Encounter (Signed)
See RX refill note. Prescriptions sent to the pharmacy.

## 2021-02-12 NOTE — Telephone Encounter (Signed)
Next Visit: 03/24/2021   Last Visit: 09/23/2020   Last Fill: 01/07/2021  DX: Psoriatic arthropathy    Current Dose per office note 09/23/2020, folic acid 2 mg daily  Okay to refill Folic Acid?

## 2021-02-12 NOTE — Telephone Encounter (Signed)
Patient's spouse calling for refill on patients MTX, and Folic Acid sent to [facility].

## 2021-03-01 ENCOUNTER — Other Ambulatory Visit: Payer: Self-pay | Admitting: Physician Assistant

## 2021-03-02 NOTE — Telephone Encounter (Signed)
Next Visit: 03/24/2021   Last Visit: 09/23/2020   Last Fill: 11/27/2020   DX: Psoriatic arthropathy    Current Dose per office note 09/23/2020, Humira 40 mg subq injections every 14 days  Labs: 01/26/2021 Glucose 107, CO2 21  Tb Gold: 01/26/2021 Neg   Okay to refill Humira?

## 2021-03-10 NOTE — Progress Notes (Deleted)
Office Visit Note  Patient: Andrew Chapman             Date of Birth: 1970-10-24           MRN: [id number]             PCP: Lorelei Pont, DO Referring: Lorelei Pont, DO Visit Date: 03/24/2021 Occupation: @GUAROCC @  Subjective:  No chief complaint on file.   History of Present Illness: Zyree Traynham is a 50 y.o. male ***   Activities of Daily Living:  Patient reports morning stiffness for *** {minute/hour:19697}.   Patient {ACTIONS;DENIES/REPORTS:21021675::"Denies"} nocturnal pain.  Difficulty dressing/grooming: {ACTIONS;DENIES/REPORTS:21021675::"Denies"} Difficulty climbing stairs: {ACTIONS;DENIES/REPORTS:21021675::"Denies"} Difficulty getting out of chair: {ACTIONS;DENIES/REPORTS:21021675::"Denies"} Difficulty using hands for taps, buttons, cutlery, and/or writing: {ACTIONS;DENIES/REPORTS:21021675::"Denies"}  No Rheumatology ROS completed.   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 Social History   Social History Narrative   Not on file   Immunization History  Administered Date(s) Administered   Moderna Sars-Covid-2 Vaccination 11/08/2019, 12/11/2019, 07/02/2020     Objective: Vital Signs: There were no vitals taken for this visit.   Physical Exam   Musculoskeletal Exam: ***  CDAI Exam: CDAI Score: -- Patient Global: --; Provider Global: -- Swollen: --; Tender: -- Joint Exam 03/24/2021   No joint exam has been documented  for this visit   There is currently no information documented on the homunculus. Go to the Rheumatology activity and complete the homunculus joint exam.  Investigation: No additional findings.  Imaging: No results found.  Recent Labs: Lab Results  Component Value Date   WBC 11.8 (H) 09/23/2020   HGB 15.2 09/23/2020   PLT 408 (H) 09/23/2020   NA 141 09/23/2020   K 4.4 09/23/2020   CL 106 09/23/2020   CO2 28 09/23/2020   GLUCOSE 85 09/23/2020   BUN 18 09/23/2020   CREATININE 0.81 09/23/2020   BILITOT 0.4 09/23/2020   ALKPHOS 74 06/27/2017   AST 20 09/23/2020   ALT 28 09/23/2020   PROT 7.2 09/23/2020   ALBUMIN 4.5 06/27/2017   CALCIUM 9.4 09/23/2020   GFRAA 122 09/23/2020   QFTBGOLDPLUS NEGATIVE 01/03/2018    Speciality Comments: No specialty comments available.  Procedures:  No procedures performed Allergies: Patient has no known allergies.   Assessment / Plan:     Visit Diagnoses: No diagnosis found.  Orders: No orders of the defined types were placed in this encounter.  No orders of the defined types were placed in this encounter.   Face-to-face time spent with patient was *** minutes. Greater than 50% of time was spent in counseling and coordination of care.  Follow-Up Instructions: No follow-ups on file.   01/05/2018, CMA  Note - This record has been created using Ellen Henri.  Chart creation errors have been sought, but may not always  have been located. Such creation errors do not reflect on  the standard of medical care.

## 2021-03-24 ENCOUNTER — Ambulatory Visit: Payer: BC Managed Care – PPO | Admitting: Physician Assistant

## 2021-03-24 DIAGNOSIS — Z79899 Other long term (current) drug therapy: Secondary | ICD-10-CM

## 2021-03-24 DIAGNOSIS — L4050 Arthropathic psoriasis, unspecified: Secondary | ICD-10-CM

## 2021-03-24 DIAGNOSIS — I10 Essential (primary) hypertension: Secondary | ICD-10-CM

## 2021-03-24 DIAGNOSIS — G8929 Other chronic pain: Secondary | ICD-10-CM

## 2021-03-24 DIAGNOSIS — Z8261 Family history of arthritis: Secondary | ICD-10-CM

## 2021-03-24 DIAGNOSIS — L409 Psoriasis, unspecified: Secondary | ICD-10-CM

## 2021-03-24 DIAGNOSIS — E6609 Other obesity due to excess calories: Secondary | ICD-10-CM

## 2021-03-24 DIAGNOSIS — Z87442 Personal history of urinary calculi: Secondary | ICD-10-CM

## 2021-04-29 NOTE — Progress Notes (Signed)
Virtual Visit via Telephone Note  I connected with Andrew Chapman on 04/30/21 at  4:00 PM EDT by telephone and verified that I am speaking with the correct person using two identifiers.  Location: Patient: Home Provider: Clinic This service was conducted via virtual visit. The patient was located at home. I was located in my office.  Consent was obtained prior to the virtual visit and is aware of possible charges through their insurance for this visit.  The patient is an established patient.  Dr. Corliss Skains, MD conducted the virtual visit and Sherron Ales, PA-C acted as scribe during the service.  Office staff helped with scheduling follow up visits after the service was conducted.     I discussed the limitations, risks, security and privacy concerns of performing an evaluation and management service by telephone and the availability of in person appointments. I also discussed with the patient that there may be a patient responsible charge related to this service. The patient expressed understanding and agreed to proceed.   CC: Psoriasis  History of Present Illness: Patient is a 50 year old male with a past medical history of psoriatic arthritis and osteoarthritis.  He is on humira 40 mg sq injections every 14 days, methotrexate 5 tablets once weekly, and folic acid 1 mg daily.  He denies any signs or symptoms of a flare.  His psoriasis has almost completely cleared on his elbows and knees.  He uses clobetasol topically twice daily PRN.  He denies any achilles tendonitis or plantar fasciitis.  He denies any SI joint discomfort.  He denies any recent infections.  He is planning on receiving the annual flu vaccine.   Review of Systems  Constitutional:  Negative for malaise/fatigue.  HENT:  Negative for congestion.   Eyes:  Negative for pain.  Respiratory:  Negative for shortness of breath.   Cardiovascular:  Negative for leg swelling.  Gastrointestinal:  Negative for constipation and diarrhea.   Genitourinary:  Negative for frequency.  Musculoskeletal:  Positive for joint pain.  Skin:  Negative for rash.  Neurological:  Negative for weakness.  Endo/Heme/Allergies:  Does not bruise/bleed easily.  Psychiatric/Behavioral:  The patient does not have insomnia.      Observations/Objective: Patient reports morning stiffness for 15 minutes.   Patient denies nocturnal pain.  Difficulty dressing/grooming: Denies Difficulty climbing stairs: Denies Difficulty getting out of chair: Denies Difficulty using hands for taps, buttons, cutlery, and/or writing: Denies  Physical Exam Neurological:     Mental Status: He is alert and oriented to person, place, and time.  Psychiatric:        Mood and Affect: Mood and affect normal.        Cognition and Memory: Memory normal.        Judgment: Judgment normal.     Assessment and Plan: Visit Diagnoses: Psoriatic arthropathy (HCC) -He has not had any signs or symptoms of a flare recently.  He has clinically been doing well on Humira 40 mg sq injections every 14 days and methotrexate 6 tablets by mouth once weekly.  He has no joint pain or inflammation at this time.  He has no achilles tendonitis or plantar fasciitis.  No SI joint discomfort.  His psoriasis has been better controlled on Humira and MTX. He uses clobetasol cream topically as needed.  He will remain on the current treatment regimen. A refill of MTX will be sent to the pharmacy today.  He was advised to notify us if he develops increased joint pain or  joint swelling.  He will follow up in 5 months.   Association of heart disease with psoriatic arthritis was discussed. Need to monitor blood pressure, cholesterol, and to exercise 30-60 minutes on daily basis was discussed.   Psoriasis-He uses clobetasol cream on as needed basis.   High risk medication use - Humira 40 mg subq injections every 14 days, methotrexate 5 tablets by mouth once weekly, and folic acid 1 mg daily. Advised that he can  reduce folic acid from 2 mg to 1 mg daily since he is on the reduced dose of MTX.   CBC and CMP updated on 01/26/21.  He is due to update lab work. Orders will be faxed to his PCPs office to have CBC with diff and CMP with GFR drawn. TB gold negative on 01/26/21 and will continue to be monitored yearly.  He has not had any recent infections.   He is up to date on covid-19 vaccines.  He was strongly encouraged to have the annual influenza.  He should also check to see if he is up to date on pneumonia, shingrix, and tetanus vaccine.  Discussed the importance of holding Humira and MTX if he develops signs or symptoms of an infection. Discussed the importance of yearly skin examinations while on Humira. Advised to schedule an appt with dermatology.  He should also try to avoid direct sun exposure and to wear sunscreen daily.    Chronic SI joint pain-Resolved   Other medical conditions are listed as follows:    Family history of psoriatic arthritis   History of kidney stones -patient states that he recently had a problem with kidney stone again.  He has modified his diet and has been drinking more fluids.   Essential hypertension-he states that the pressure has been better controlled.  He is fostering a baby who is 41 months old currently.  He and his wife may adopt this baby.  He could not come for an office visit as he is taking care of the baby.  Follow Up Instructions: He will follow up in 5 months.    I discussed the assessment and treatment plan with the patient. The patient was provided an opportunity to ask questions and all were answered. The patient agreed with the plan and demonstrated an understanding of the instructions.   The patient was advised to call back or seek an in-person evaluation if the symptoms worsen or if the condition fails to improve as anticipated.  I provided 25 minutes of non-face-to-face time during this encounter.   Pollyann Savoy, MD

## 2021-04-30 ENCOUNTER — Telehealth (INDEPENDENT_AMBULATORY_CARE_PROVIDER_SITE_OTHER): Payer: BC Managed Care – PPO | Admitting: Rheumatology

## 2021-04-30 ENCOUNTER — Encounter: Payer: Self-pay | Admitting: Rheumatology

## 2021-04-30 ENCOUNTER — Other Ambulatory Visit: Payer: Self-pay

## 2021-04-30 VITALS — Ht 75.0 in

## 2021-04-30 DIAGNOSIS — Z6836 Body mass index (BMI) 36.0-36.9, adult: Secondary | ICD-10-CM

## 2021-04-30 DIAGNOSIS — L4050 Arthropathic psoriasis, unspecified: Secondary | ICD-10-CM

## 2021-04-30 DIAGNOSIS — E6609 Other obesity due to excess calories: Secondary | ICD-10-CM

## 2021-04-30 DIAGNOSIS — Z87442 Personal history of urinary calculi: Secondary | ICD-10-CM

## 2021-04-30 DIAGNOSIS — G8929 Other chronic pain: Secondary | ICD-10-CM

## 2021-04-30 DIAGNOSIS — I10 Essential (primary) hypertension: Secondary | ICD-10-CM

## 2021-04-30 DIAGNOSIS — L409 Psoriasis, unspecified: Secondary | ICD-10-CM

## 2021-04-30 DIAGNOSIS — M533 Sacrococcygeal disorders, not elsewhere classified: Secondary | ICD-10-CM | POA: Diagnosis not present

## 2021-04-30 DIAGNOSIS — Z840 Family history of diseases of the skin and subcutaneous tissue: Secondary | ICD-10-CM

## 2021-04-30 DIAGNOSIS — E66812 Obesity, class 2: Secondary | ICD-10-CM

## 2021-04-30 DIAGNOSIS — Z8261 Family history of arthritis: Secondary | ICD-10-CM

## 2021-04-30 DIAGNOSIS — Z79899 Other long term (current) drug therapy: Secondary | ICD-10-CM

## 2021-04-30 MED ORDER — METHOTREXATE SODIUM 2.5 MG PO TABS: ORAL_TABLET | ORAL | 0 refills | Status: DC

## 2021-05-14 ENCOUNTER — Telehealth: Payer: Self-pay | Admitting: *Deleted

## 2021-05-14 NOTE — Telephone Encounter (Signed)
Labs received from: Dr. Shayne Alken  Drawn on:05/08/2021  Reviewed by:Sherron Ales, PA-C  Labs drawn:CBC with diff  Results:WNL

## 2021-08-15 ENCOUNTER — Other Ambulatory Visit: Payer: Self-pay | Admitting: Physician Assistant

## 2021-08-18 NOTE — Telephone Encounter (Signed)
Patient is due to update lab work.

## 2021-08-18 NOTE — Telephone Encounter (Signed)
Next Visit: 10/02/2021  Last Visit: 04/30/2021  Last Fill: 03/02/2021  GK:KDPTELMRA arthropathy   Current Dose per office note 04/30/2021: Humira 40 mg subq injections every 14 days  Labs: 05/08/2021 WNL  TB Gold: 01/26/2021 Neg   Okay to refill Humira?

## 2021-08-18 NOTE — Telephone Encounter (Signed)
Attempted to contact the patient and left message to advise patient he is due to update labs.

## 2021-08-20 ENCOUNTER — Other Ambulatory Visit: Payer: Self-pay | Admitting: *Deleted

## 2021-08-20 DIAGNOSIS — L409 Psoriasis, unspecified: Secondary | ICD-10-CM

## 2021-08-20 DIAGNOSIS — Z79899 Other long term (current) drug therapy: Secondary | ICD-10-CM

## 2021-08-20 DIAGNOSIS — L4050 Arthropathic psoriasis, unspecified: Secondary | ICD-10-CM

## 2021-09-01 ENCOUNTER — Telehealth: Payer: Self-pay | Admitting: *Deleted

## 2021-09-01 NOTE — Telephone Encounter (Signed)
Labs received from:Dr. Lorelei Pont  Drawn on:08/27/2021  Reviewed by:Sherron Ales, PA-C  Labs drawn:CBC/CMP  Results: WBC 13.17   Polys, Abs Count 8.83   Monos, Abs Count 1.36   Sodium 146   BUN 26  Patient on Humira 40 mg SQ every 14 days and MTX 5 tabs po weekly.   Per Ladona Ridgel: Please clarify if patient has had any recent infections.   Spoke with patient's wife, Jeanice Lim, she states patient has been fighting croup and an URI. She states patient has not been holding his medication. Advised Holly to have patient hold his medications until he is well. She expressed understanding.

## 2021-09-18 NOTE — Progress Notes (Deleted)
Office Visit Note  Patient: Andrew Chapman             Date of Birth: 07/06/1971           MRN: [id number]             PCP: Emelda Fear, DO Referring: Emelda Fear, DO Visit Date: 10/02/2021 Occupation: @GUAROCC @  Subjective:  No chief complaint on file.   History of Present Illness: Andrew Chapman is a 51 y.o. male ***   Activities of Daily Living:  Patient reports morning stiffness for *** {minute/hour:19697}.   Patient {ACTIONS;DENIES/REPORTS:21021675::"Denies"} nocturnal pain.  Difficulty dressing/grooming: {ACTIONS;DENIES/REPORTS:21021675::"Denies"} Difficulty climbing stairs: {ACTIONS;DENIES/REPORTS:21021675::"Denies"} Difficulty getting out of chair: {ACTIONS;DENIES/REPORTS:21021675::"Denies"} Difficulty using hands for taps, buttons, cutlery, and/or writing: {ACTIONS;DENIES/REPORTS:21021675::"Denies"}  No Rheumatology ROS completed.   PMFS History:  Patient Active Problem List   Diagnosis Date Noted   Essential hypertension 09/23/2020   History of kidney stones 09/23/2020   Family history of psoriatic arthritis 03/02/2017   Psoriasis 03/01/2017   Psoriatic arthropathy ([place]) 03/01/2017   High risk medication use 03/01/2017   History of gastroesophageal reflux (GERD) 03/01/2017   Chronic SI joint pain 03/01/2017    Past Medical History:  Diagnosis Date   Arthritis    Hypertension    Psoriasis     Family History  Problem Relation Age of Onset   Diabetes Mother    No past surgical history on file. Social History   Social History Narrative   Not on file   Immunization History  Administered Date(s) Administered   Moderna Sars-Covid-2 Vaccination 11/08/2019, 12/11/2019, 07/02/2020     Objective: Vital Signs: There were no vitals taken for this visit.   Physical Exam   Musculoskeletal Exam: ***  CDAI Exam: CDAI Score: -- Patient Global: --; Provider Global: -- Swollen: --; Tender: -- Joint Exam 10/02/2021   No joint exam has  been documented for this visit   There is currently no information documented on the homunculus. Go to the Rheumatology activity and complete the homunculus joint exam.  Investigation: No additional findings.  Imaging: No results found.  Recent Labs: Lab Results  Component Value Date   WBC 11.8 (H) 09/23/2020   HGB 15.2 09/23/2020   PLT 408 (H) 09/23/2020   NA 141 09/23/2020   K 4.4 09/23/2020   CL 106 09/23/2020   CO2 28 09/23/2020   GLUCOSE 85 09/23/2020   BUN 18 09/23/2020   CREATININE 0.81 09/23/2020   BILITOT 0.4 09/23/2020   ALKPHOS 74 06/27/2017   AST 20 09/23/2020   ALT 28 09/23/2020   PROT 7.2 09/23/2020   ALBUMIN 4.5 06/27/2017   CALCIUM 9.4 09/23/2020   GFRAA 122 09/23/2020   QFTBGOLDPLUS NEGATIVE 01/03/2018    Speciality Comments: No specialty comments available.  Procedures:  No procedures performed Allergies: Patient has no known allergies.   Assessment / Plan:     Visit Diagnoses: No diagnosis found.  Orders: No orders of the defined types were placed in this encounter.  No orders of the defined types were placed in this encounter.   Face-to-face time spent with patient was *** minutes. Greater than 50% of time was spent in counseling and coordination of care.  Follow-Up Instructions: No follow-ups on file.   Earnestine Mealing, CMA  Note - This record has been created using Editor, commissioning.  Chart creation errors have been sought, but may not always  have been located. Such creation errors do not reflect on  the standard of medical care.

## 2021-09-24 ENCOUNTER — Other Ambulatory Visit: Payer: Self-pay | Admitting: Rheumatology

## 2021-09-24 MED ORDER — METHOTREXATE SODIUM 2.5 MG PO TABS: ORAL_TABLET | ORAL | 0 refills | Status: AC

## 2021-09-24 NOTE — Telephone Encounter (Signed)
Patients wife called the office requesting a refill of Methotrexate 2.5mg  [facility].

## 2021-09-24 NOTE — Telephone Encounter (Signed)
Next Visit: 10/02/2021   Last Visit: 04/30/2021   Last Fill: 04/30/2021   [id number] arthropathy    Current Dose per office note 04/30/2021:  MTX 5 tabs po weekly  Labs: 08/27/2021 WBC 13.17   Polys, Abs Count 8.83   Monos, Abs Count 1.36   Sodium 146   BUN 26  Okay to refill MTX?

## 2021-10-02 ENCOUNTER — Ambulatory Visit: Payer: BC Managed Care – PPO | Admitting: Rheumatology

## 2021-10-02 DIAGNOSIS — Z87442 Personal history of urinary calculi: Secondary | ICD-10-CM

## 2021-10-02 DIAGNOSIS — Z79899 Other long term (current) drug therapy: Secondary | ICD-10-CM

## 2021-10-02 DIAGNOSIS — L4050 Arthropathic psoriasis, unspecified: Secondary | ICD-10-CM

## 2021-10-02 DIAGNOSIS — G8929 Other chronic pain: Secondary | ICD-10-CM

## 2021-10-02 DIAGNOSIS — L409 Psoriasis, unspecified: Secondary | ICD-10-CM

## 2021-10-02 DIAGNOSIS — Z840 Family history of diseases of the skin and subcutaneous tissue: Secondary | ICD-10-CM

## 2021-10-02 DIAGNOSIS — I10 Essential (primary) hypertension: Secondary | ICD-10-CM

## 2021-10-21 ENCOUNTER — Telehealth: Payer: Self-pay | Admitting: Rheumatology

## 2021-10-21 NOTE — Telephone Encounter (Signed)
Patient's wife called the office stating they tried to order Andrew Chapman's Humira through [facility] and it was denied due to needing a PA imitated for this year. [name] would like to get that started. ?

## 2021-10-23 NOTE — Telephone Encounter (Signed)
Submitted a Prior Authorization request to [employer] for HUMIRA via CoverMyMeds. Will update once we receive a response. ? ? ?Key: [id number] ?

## 2021-10-27 ENCOUNTER — Other Ambulatory Visit (HOSPITAL_COMMUNITY): Payer: Self-pay

## 2021-10-27 NOTE — Telephone Encounter (Addendum)
Received notification from [facility] regarding a prior authorization for HUMIRA. Authorization has been APPROVED from 09/27/2021 to 10/27/2022.  ? ?Patient must continue to fill through [facility]: [phone] ? ?Authorization # [id number] ? ?ATC and LVM informing pt of PA approval and that he should be able to simply contact pharmacy and request refill.  ? ?Nothing further needed at this time. ?

## 2021-10-30 ENCOUNTER — Telehealth: Payer: Self-pay

## 2021-10-30 DIAGNOSIS — L4050 Arthropathic psoriasis, unspecified: Secondary | ICD-10-CM

## 2021-10-30 DIAGNOSIS — L409 Psoriasis, unspecified: Secondary | ICD-10-CM

## 2021-10-30 DIAGNOSIS — Z79899 Other long term (current) drug therapy: Secondary | ICD-10-CM

## 2021-10-30 NOTE — Telephone Encounter (Signed)
Patient's wife Jeanice Lim left a voicemail regarding his prescription of Humira.  She states she talked to [facility] last week and they needed a PA which has been done.   Now they are saying they need a new prescription.  Please call #[phone] to set up a new refill.   ?

## 2021-11-02 ENCOUNTER — Other Ambulatory Visit (HOSPITAL_COMMUNITY): Payer: Self-pay

## 2021-11-02 MED ORDER — HUMIRA PEN 40 MG/0.8ML ~~LOC~~ PNKT: PEN_INJECTOR | SUBCUTANEOUS | 0 refills | Status: AC

## 2021-11-02 NOTE — Telephone Encounter (Signed)
Rx for Humira sent to [facility]. Called patient to advise. ? ?Patient due for labs 11/26/21 ? ?Chesley Mires, PharmD, MPH, BCPS ?Clinical Pharmacist (Rheumatology and Pulmonology) ?

## 2021-12-03 NOTE — Progress Notes (Deleted)
Office Visit Note  Patient: Andrew Chapman             Date of Birth: 05-24-1971           MRN: [id number]             PCP: Emelda Fear, DO Referring: Emelda Fear, DO Visit Date: 12/17/2021 Occupation: @GUAROCC @  Subjective:  No chief complaint on file.   History of Present Illness: Andrew Chapman is a 51 y.o. male ***   Activities of Daily Living:  Patient reports morning stiffness for *** {minute/hour:19697}.   Patient {ACTIONS;DENIES/REPORTS:21021675::"Denies"} nocturnal pain.  Difficulty dressing/grooming: {ACTIONS;DENIES/REPORTS:21021675::"Denies"} Difficulty climbing stairs: {ACTIONS;DENIES/REPORTS:21021675::"Denies"} Difficulty getting out of chair: {ACTIONS;DENIES/REPORTS:21021675::"Denies"} Difficulty using hands for taps, buttons, cutlery, and/or writing: {ACTIONS;DENIES/REPORTS:21021675::"Denies"}  No Rheumatology ROS completed.   PMFS History:  Patient Active Problem List   Diagnosis Date Noted   Essential hypertension 09/23/2020   History of kidney stones 09/23/2020   Family history of psoriatic arthritis 03/02/2017   Psoriasis 03/01/2017   Psoriatic arthropathy ([place]) 03/01/2017   High risk medication use 03/01/2017   History of gastroesophageal reflux (GERD) 03/01/2017   Chronic SI joint pain 03/01/2017    Past Medical History:  Diagnosis Date   Arthritis    Hypertension    Psoriasis     Family History  Problem Relation Age of Onset   Diabetes Mother    No past surgical history on file. Social History   Social History Narrative   Not on file   Immunization History  Administered Date(s) Administered   Moderna Sars-Covid-2 Vaccination 11/08/2019, 12/11/2019, 07/02/2020     Objective: Vital Signs: There were no vitals taken for this visit.   Physical Exam   Musculoskeletal Exam: ***  CDAI Exam: CDAI Score: -- Patient Global: --; Provider Global: -- Swollen: --; Tender: -- Joint Exam 12/17/2021   No joint exam has been documented  for this visit   There is currently no information documented on the homunculus. Go to the Rheumatology activity and complete the homunculus joint exam.  Investigation: No additional findings.  Imaging: No results found.  Recent Labs: Lab Results  Component Value Date   WBC 11.8 (H) 09/23/2020   HGB 15.2 09/23/2020   PLT 408 (H) 09/23/2020   NA 141 09/23/2020   K 4.4 09/23/2020   CL 106 09/23/2020   CO2 28 09/23/2020   GLUCOSE 85 09/23/2020   BUN 18 09/23/2020   CREATININE 0.81 09/23/2020   BILITOT 0.4 09/23/2020   ALKPHOS 74 06/27/2017   AST 20 09/23/2020   ALT 28 09/23/2020   PROT 7.2 09/23/2020   ALBUMIN 4.5 06/27/2017   CALCIUM 9.4 09/23/2020   GFRAA 122 09/23/2020   QFTBGOLDPLUS NEGATIVE 01/03/2018    Speciality Comments: No specialty comments available.  Procedures:  No procedures performed Allergies: Patient has no known allergies.   Assessment / Plan:     Visit Diagnoses: Psoriatic arthropathy ([place])  Psoriasis  High risk medication use  Chronic SI joint pain  Family history of psoriatic arthritis  History of kidney stones  Class 2 obesity due to excess calories without serious comorbidity with body mass index (BMI) of 36.0 to 36.9 in adult  Essential hypertension  Orders: No orders of the defined types were placed in this encounter.  No orders of the defined types were placed in this encounter.   Face-to-face time spent with patient was *** minutes. Greater than 50% of time was spent in counseling and coordination of care.  Follow-Up Instructions: No follow-ups  on file.   [name], PA-C  Note - This record has been created using Dragon software.  Chart creation errors have been sought, but may not always  have been located. Such creation errors do not reflect on  the standard of medical care.

## 2021-12-17 ENCOUNTER — Ambulatory Visit: Payer: BC Managed Care – PPO | Admitting: Physician Assistant

## 2021-12-17 DIAGNOSIS — I10 Essential (primary) hypertension: Secondary | ICD-10-CM

## 2021-12-17 DIAGNOSIS — G8929 Other chronic pain: Secondary | ICD-10-CM

## 2021-12-17 DIAGNOSIS — Z87442 Personal history of urinary calculi: Secondary | ICD-10-CM

## 2021-12-17 DIAGNOSIS — Z840 Family history of diseases of the skin and subcutaneous tissue: Secondary | ICD-10-CM

## 2021-12-17 DIAGNOSIS — E6609 Other obesity due to excess calories: Secondary | ICD-10-CM

## 2021-12-17 DIAGNOSIS — Z79899 Other long term (current) drug therapy: Secondary | ICD-10-CM

## 2021-12-17 DIAGNOSIS — L4050 Arthropathic psoriasis, unspecified: Secondary | ICD-10-CM

## 2021-12-17 DIAGNOSIS — L409 Psoriasis, unspecified: Secondary | ICD-10-CM

## 2022-01-07 NOTE — Progress Notes (Deleted)
Office Visit Note  Patient: Andrew Chapman             Date of Birth: 1971-05-05           MRN: [id number]             PCP: Lorelei Pont, DO Referring: Lorelei Pont, DO Visit Date: 01/21/2022 Occupation: @GUAROCC @  Subjective:  No chief complaint on file.   History of Present Illness: Zahir Eisenhour is a 51 y.o. male ***   Activities of Daily Living:  Patient reports morning stiffness for *** {minute/hour:19697}.   Patient {ACTIONS;DENIES/REPORTS:21021675::"Denies"} nocturnal pain.  Difficulty dressing/grooming: {ACTIONS;DENIES/REPORTS:21021675::"Denies"} Difficulty climbing stairs: {ACTIONS;DENIES/REPORTS:21021675::"Denies"} Difficulty getting out of chair: {ACTIONS;DENIES/REPORTS:21021675::"Denies"} Difficulty using hands for taps, buttons, cutlery, and/or writing: {ACTIONS;DENIES/REPORTS:21021675::"Denies"}  No Rheumatology ROS completed.   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 Social History   Social History Narrative   Not on file   Immunization History  Administered Date(s) Administered   Moderna Sars-Covid-2 Vaccination 11/08/2019, 12/11/2019, 07/02/2020     Objective: Vital Signs: There were no vitals taken for this visit.   Physical Exam   Musculoskeletal Exam: ***  CDAI Exam: CDAI Score: -- Patient Global: --; Provider Global: -- Swollen: --; Tender: -- Joint Exam 01/21/2022   No joint exam has been documented  for this visit   There is currently no information documented on the homunculus. Go to the Rheumatology activity and complete the homunculus joint exam.  Investigation: No additional findings.  Imaging: No results found.  Recent Labs: Lab Results  Component Value Date   WBC 11.8 (H) 09/23/2020   HGB 15.2 09/23/2020   PLT 408 (H) 09/23/2020   NA 141 09/23/2020   K 4.4 09/23/2020   CL 106 09/23/2020   CO2 28 09/23/2020   GLUCOSE 85 09/23/2020   BUN 18 09/23/2020   CREATININE 0.81 09/23/2020   BILITOT 0.4 09/23/2020   ALKPHOS 74 06/27/2017   AST 20 09/23/2020   ALT 28 09/23/2020   PROT 7.2 09/23/2020   ALBUMIN 4.5 06/27/2017   CALCIUM 9.4 09/23/2020   GFRAA 122 09/23/2020   QFTBGOLDPLUS NEGATIVE 01/03/2018    Speciality Comments: No specialty comments available.  Procedures:  No procedures performed Allergies: Patient has no known allergies.   Assessment / Plan:     Visit Diagnoses: No diagnosis found.  Orders: No orders of the defined types were placed in this encounter.  No orders of the defined types were placed in this encounter.   Face-to-face time spent with patient was *** minutes. Greater than 50% of time was spent in counseling and coordination of care.  Follow-Up Instructions: No follow-ups on file.   01/05/2018, CMA  Note - This record has been created using Ellen Henri.  Chart creation errors have been sought, but may not always  have been located. Such creation errors do not reflect on  the standard of medical care.

## 2022-01-21 ENCOUNTER — Ambulatory Visit: Payer: BC Managed Care – PPO | Admitting: Physician Assistant

## 2022-01-21 DIAGNOSIS — Z840 Family history of diseases of the skin and subcutaneous tissue: Secondary | ICD-10-CM

## 2022-01-21 DIAGNOSIS — G8929 Other chronic pain: Secondary | ICD-10-CM

## 2022-01-21 DIAGNOSIS — L4050 Arthropathic psoriasis, unspecified: Secondary | ICD-10-CM

## 2022-01-21 DIAGNOSIS — L409 Psoriasis, unspecified: Secondary | ICD-10-CM

## 2022-01-21 DIAGNOSIS — Z79899 Other long term (current) drug therapy: Secondary | ICD-10-CM

## 2022-01-21 DIAGNOSIS — Z87442 Personal history of urinary calculi: Secondary | ICD-10-CM

## 2022-01-21 DIAGNOSIS — I10 Essential (primary) hypertension: Secondary | ICD-10-CM

## 2022-02-16 ENCOUNTER — Other Ambulatory Visit: Payer: Self-pay | Admitting: Rheumatology

## 2022-02-16 DIAGNOSIS — L409 Psoriasis, unspecified: Secondary | ICD-10-CM

## 2022-02-16 DIAGNOSIS — L4050 Arthropathic psoriasis, unspecified: Secondary | ICD-10-CM

## 2022-02-16 DIAGNOSIS — Z79899 Other long term (current) drug therapy: Secondary | ICD-10-CM

## 2022-02-20 ENCOUNTER — Other Ambulatory Visit: Payer: Self-pay | Admitting: Rheumatology

## 2022-02-20 DIAGNOSIS — L409 Psoriasis, unspecified: Secondary | ICD-10-CM

## 2022-02-20 DIAGNOSIS — L4050 Arthropathic psoriasis, unspecified: Secondary | ICD-10-CM

## 2022-02-20 DIAGNOSIS — Z79899 Other long term (current) drug therapy: Secondary | ICD-10-CM

## 2023-07-18 ENCOUNTER — Ambulatory Visit: Payer: BC Managed Care – PPO | Admitting: Physician Assistant
